# Patient Record
Sex: Female | Born: 1955
Health system: Southern US, Community
[De-identification: ages and names within clinical notes are randomized; demographics above are authoritative.]

## PROBLEM LIST (undated history)

## (undated) DIAGNOSIS — E8881 Metabolic syndrome: Secondary | ICD-10-CM

## (undated) DIAGNOSIS — E663 Overweight: Secondary | ICD-10-CM

## (undated) DIAGNOSIS — R928 Other abnormal and inconclusive findings on diagnostic imaging of breast: Secondary | ICD-10-CM

## (undated) DIAGNOSIS — K2270 Barrett's esophagus without dysplasia: Secondary | ICD-10-CM

## (undated) DIAGNOSIS — K219 Gastro-esophageal reflux disease without esophagitis: Secondary | ICD-10-CM

## (undated) DIAGNOSIS — M25562 Pain in left knee: Secondary | ICD-10-CM

## (undated) DIAGNOSIS — E039 Hypothyroidism, unspecified: Secondary | ICD-10-CM

## (undated) DIAGNOSIS — E559 Vitamin D deficiency, unspecified: Secondary | ICD-10-CM

## (undated) DIAGNOSIS — E8940 Asymptomatic postprocedural ovarian failure: Secondary | ICD-10-CM

## (undated) DIAGNOSIS — E785 Hyperlipidemia, unspecified: Secondary | ICD-10-CM

## (undated) DIAGNOSIS — T7840XA Allergy, unspecified, initial encounter: Secondary | ICD-10-CM

## (undated) HISTORY — DX: Barrett's esophagus without dysplasia: K22.70

## (undated) HISTORY — DX: Gastro-esophageal reflux disease without esophagitis: K21.9

## (undated) HISTORY — DX: Hyperlipidemia, unspecified: E78.5

## (undated) HISTORY — DX: Vitamin D deficiency, unspecified: E55.9

## (undated) HISTORY — DX: Hypothyroidism, unspecified: E03.9

## (undated) HISTORY — DX: Pain in left knee: M25.562

## (undated) HISTORY — DX: Metabolic syndrome: E88.810

## (undated) HISTORY — DX: Other abnormal and inconclusive findings on diagnostic imaging of breast: R92.8

## (undated) HISTORY — DX: Overweight: E66.3

## (undated) HISTORY — DX: Metabolic syndrome: E88.81

## (undated) HISTORY — DX: Asymptomatic postprocedural ovarian failure: E89.40

## (undated) HISTORY — PX: ABDOMINAL HYSTERECTOMY: SHX81

## (undated) HISTORY — DX: Allergy, unspecified, initial encounter: T78.40XA

---

## 2004-07-22 ENCOUNTER — Ambulatory Visit: Payer: Self-pay | Admitting: Family Medicine

## 2005-08-05 ENCOUNTER — Ambulatory Visit: Payer: Self-pay | Admitting: Family Medicine

## 2006-07-07 ENCOUNTER — Ambulatory Visit: Payer: Self-pay | Admitting: Unknown Physician Specialty

## 2006-08-23 ENCOUNTER — Ambulatory Visit: Payer: Self-pay | Admitting: Family Medicine

## 2007-08-24 ENCOUNTER — Ambulatory Visit: Payer: Self-pay | Admitting: Family Medicine

## 2008-08-25 ENCOUNTER — Ambulatory Visit: Payer: Self-pay | Admitting: Family Medicine

## 2008-10-24 ENCOUNTER — Ambulatory Visit: Payer: Self-pay | Admitting: Unknown Physician Specialty

## 2009-09-02 ENCOUNTER — Ambulatory Visit: Payer: Self-pay | Admitting: Family Medicine

## 2011-02-08 ENCOUNTER — Ambulatory Visit: Payer: Self-pay | Admitting: Family Medicine

## 2012-06-20 ENCOUNTER — Ambulatory Visit: Payer: Self-pay

## 2012-07-31 ENCOUNTER — Ambulatory Visit: Payer: Self-pay

## 2013-01-20 ENCOUNTER — Emergency Department: Payer: Self-pay | Admitting: Emergency Medicine

## 2013-01-30 ENCOUNTER — Ambulatory Visit: Payer: Self-pay

## 2013-07-17 ENCOUNTER — Ambulatory Visit: Payer: Self-pay | Admitting: Family Medicine

## 2013-07-17 LAB — HM MAMMOGRAPHY: HM MAMMO: NORMAL

## 2014-01-07 LAB — HM COLONOSCOPY: HM Colonoscopy: NORMAL

## 2014-01-09 ENCOUNTER — Ambulatory Visit: Payer: Self-pay | Admitting: Unknown Physician Specialty

## 2014-01-10 ENCOUNTER — Other Ambulatory Visit: Payer: Self-pay | Admitting: Unknown Physician Specialty

## 2014-01-10 DIAGNOSIS — Q438 Other specified congenital malformations of intestine: Secondary | ICD-10-CM

## 2014-01-22 ENCOUNTER — Ambulatory Visit
Admission: RE | Admit: 2014-01-22 | Discharge: 2014-01-22 | Disposition: A | Discharge status: Home / Self Care | Payer: BC Managed Care – PPO | Source: Ambulatory Visit | Attending: Unknown Physician Specialty | Admitting: Unknown Physician Specialty

## 2014-01-22 DIAGNOSIS — Q438 Other specified congenital malformations of intestine: Secondary | ICD-10-CM

## 2014-02-19 LAB — HEMOGLOBIN A1C: HEMOGLOBIN A1C: 6.3 % — AB (ref 4.0–6.0)

## 2014-02-19 LAB — LIPID PANEL
CHOLESTEROL: 246 mg/dL — AB (ref 0–200)
HDL: 62 mg/dL (ref 35–70)
LDL Cholesterol: 167 mg/dL
Triglycerides: 87 mg/dL (ref 40–160)

## 2014-05-09 ENCOUNTER — Other Ambulatory Visit: Payer: Self-pay | Admitting: Family Medicine

## 2014-05-09 DIAGNOSIS — Z1231 Encounter for screening mammogram for malignant neoplasm of breast: Secondary | ICD-10-CM

## 2014-05-19 LAB — SURGICAL PATHOLOGY

## 2014-07-24 ENCOUNTER — Ambulatory Visit
Admission: RE | Admit: 2014-07-24 | Discharge: 2014-07-24 | Disposition: A | Discharge status: Home / Self Care | Payer: BLUE CROSS/BLUE SHIELD | Source: Ambulatory Visit | Attending: Family Medicine | Admitting: Family Medicine

## 2014-07-24 DIAGNOSIS — Z1231 Encounter for screening mammogram for malignant neoplasm of breast: Secondary | ICD-10-CM | POA: Insufficient documentation

## 2014-08-07 ENCOUNTER — Encounter: Payer: Self-pay | Admitting: Family Medicine

## 2014-08-07 DIAGNOSIS — Z833 Family history of diabetes mellitus: Secondary | ICD-10-CM | POA: Insufficient documentation

## 2014-08-07 DIAGNOSIS — E8881 Metabolic syndrome: Secondary | ICD-10-CM | POA: Insufficient documentation

## 2014-08-07 DIAGNOSIS — D126 Benign neoplasm of colon, unspecified: Secondary | ICD-10-CM | POA: Insufficient documentation

## 2014-08-07 DIAGNOSIS — K2100 Gastro-esophageal reflux disease with esophagitis, without bleeding: Secondary | ICD-10-CM | POA: Insufficient documentation

## 2014-08-07 DIAGNOSIS — K2270 Barrett's esophagus without dysplasia: Secondary | ICD-10-CM | POA: Insufficient documentation

## 2014-08-07 DIAGNOSIS — J3089 Other allergic rhinitis: Secondary | ICD-10-CM | POA: Insufficient documentation

## 2014-08-07 DIAGNOSIS — E785 Hyperlipidemia, unspecified: Secondary | ICD-10-CM | POA: Insufficient documentation

## 2014-08-07 DIAGNOSIS — E559 Vitamin D deficiency, unspecified: Secondary | ICD-10-CM | POA: Insufficient documentation

## 2014-08-07 DIAGNOSIS — E039 Hypothyroidism, unspecified: Secondary | ICD-10-CM | POA: Insufficient documentation

## 2014-08-07 DIAGNOSIS — Z86018 Personal history of other benign neoplasm: Secondary | ICD-10-CM | POA: Insufficient documentation

## 2014-08-07 DIAGNOSIS — E669 Obesity, unspecified: Secondary | ICD-10-CM | POA: Insufficient documentation

## 2014-08-08 ENCOUNTER — Ambulatory Visit (INDEPENDENT_AMBULATORY_CARE_PROVIDER_SITE_OTHER): Payer: BLUE CROSS/BLUE SHIELD | Admitting: Family Medicine

## 2014-08-08 ENCOUNTER — Encounter: Payer: Self-pay | Admitting: Family Medicine

## 2014-08-08 VITALS — BP 118/70 | HR 86 | Temp 97.9°F | Resp 16 | Ht 60.0 in | Wt 152.4 lb

## 2014-08-08 DIAGNOSIS — K2270 Barrett's esophagus without dysplasia: Secondary | ICD-10-CM | POA: Diagnosis not present

## 2014-08-08 DIAGNOSIS — Z79899 Other long term (current) drug therapy: Secondary | ICD-10-CM

## 2014-08-08 DIAGNOSIS — N958 Other specified menopausal and perimenopausal disorders: Secondary | ICD-10-CM

## 2014-08-08 DIAGNOSIS — E8881 Metabolic syndrome: Secondary | ICD-10-CM | POA: Diagnosis not present

## 2014-08-08 DIAGNOSIS — E039 Hypothyroidism, unspecified: Secondary | ICD-10-CM

## 2014-08-08 DIAGNOSIS — K2100 Gastro-esophageal reflux disease with esophagitis, without bleeding: Secondary | ICD-10-CM

## 2014-08-08 DIAGNOSIS — Z862 Personal history of diseases of the blood and blood-forming organs and certain disorders involving the immune mechanism: Secondary | ICD-10-CM | POA: Insufficient documentation

## 2014-08-08 DIAGNOSIS — Z780 Asymptomatic menopausal state: Secondary | ICD-10-CM

## 2014-08-08 DIAGNOSIS — E559 Vitamin D deficiency, unspecified: Secondary | ICD-10-CM | POA: Diagnosis not present

## 2014-08-08 DIAGNOSIS — J3089 Other allergic rhinitis: Secondary | ICD-10-CM

## 2014-08-08 DIAGNOSIS — E785 Hyperlipidemia, unspecified: Secondary | ICD-10-CM

## 2014-08-08 DIAGNOSIS — D509 Iron deficiency anemia, unspecified: Secondary | ICD-10-CM

## 2014-08-08 DIAGNOSIS — Z90710 Acquired absence of both cervix and uterus: Secondary | ICD-10-CM | POA: Insufficient documentation

## 2014-08-08 DIAGNOSIS — J309 Allergic rhinitis, unspecified: Secondary | ICD-10-CM

## 2014-08-08 MED ORDER — LEVOTHYROXINE SODIUM 25 MCG PO TABS: 25.0000 ug | ORAL_TABLET | Freq: Every day | ORAL | Status: DC

## 2014-08-08 MED ORDER — ESTRADIOL 0.5 MG PO TABS: 0.5000 mg | ORAL_TABLET | Freq: Every day | ORAL | Status: DC

## 2014-08-08 MED ORDER — MONTELUKAST SODIUM 10 MG PO TABS: 10.0000 mg | ORAL_TABLET | Freq: Every day | ORAL | Status: DC

## 2014-08-08 MED ORDER — FERROUS SULFATE 325 (65 FE) MG PO TABS: 325.0000 mg | ORAL_TABLET | Freq: Every day | ORAL | Status: DC

## 2014-08-08 MED ORDER — PANTOPRAZOLE SODIUM 40 MG PO TBEC: 40.0000 mg | DELAYED_RELEASE_TABLET | Freq: Every day | ORAL | Status: DC

## 2014-08-08 MED ORDER — FLUTICASONE PROPIONATE 50 MCG/ACT NA SUSP: 2.0000 | Freq: Every day | NASAL | Status: DC

## 2014-08-08 NOTE — Progress Notes (Signed)
Name: Colleen Curry   MRN: [id number]    DOB: Dec 03, 1955   Date:08/08/2014       Progress Note  Subjective  Chief Complaint  Chief Complaint  Patient presents with  . Hypothyroidism  . Allergic Rhinitis     HPI  Hypothyroidism: taking Synthroid  25 mcg daily, she denies constipation, but she has dry skin, using moisturizer, no weight gain.  AR: she has symptoms year round, but at this time controlled with medication, occasional sneezing, no rhinorrhea but still has some congestion   Menopause: s/p hysterectomy in 1991 for fibroid tumors, she has been taking Estradiol for many years, hot flashes and night sweats are not as intense.   Iron deficiency anemia: diagnosed last fall, seen by GI. Up to date with EGD and colonoscopy . She has a history of Barretts esophagus, taking iron supplementation. Denies fatigue, SOB or chest pain. She was not eating meat last year, but has incorporated it to her diet now  GERD: under control with medications  Hyperlipidemia: on diet and exercise only, never took statins   Patient Active Problem List   Diagnosis Date Noted  . History of hysterectomy 08/08/2014  . Menopause 08/08/2014  . Iron deficiency anemia 08/08/2014  . Perennial allergic rhinitis 08/07/2014  . Barrett esophagus 08/07/2014  . Dyslipidemia 08/07/2014  . Family history of diabetes mellitus 08/07/2014  . Esophagitis, reflux 08/07/2014  . Acquired hypothyroidism 08/07/2014  . Dysmetabolic syndrome 78/67/6720  . Obesity (BMI 30-39.9) 08/07/2014  . Vitamin D deficiency 08/07/2014  . Tubular adenoma of colon 08/07/2014    Past Surgical History  Procedure Laterality Date  . Abdominal hysterectomy      Family History  Problem Relation Age of Onset  . Glaucoma Brother   . Stroke Paternal Grandfather     History   Social History  . Marital Status: Divorced    Spouse Name: N/A  . Number of Children: N/A  . Years of Education: N/A   Occupational History  . Not on  file.   Social History Main Topics  . Smoking status: Never Smoker   . Smokeless tobacco: Not on file  . Alcohol Use: No  . Drug Use: No  . Sexual Activity: Not on file   Other Topics Concern  . Not on file   Social History Narrative     Current outpatient prescriptions:  .  aspirin (ASPIRIN ADULT LOW STRENGTH) 81 MG chewable tablet, Chew by mouth., Disp: , Rfl:  .  Azelastine HCl 0.15 % SOLN, Place into the nose., Disp: , Rfl:  .  Biotin 1000 MCG tablet, Take 1 tablet by mouth daily., Disp: , Rfl:  .  Cholecalciferol (VITAMIN D-1000 MAX ST) 1000 UNITS tablet, Take 1 tablet by mouth daily., Disp: , Rfl:  .  estradiol (ESTRACE) 0.5 MG tablet, Take 1 tablet (0.5 mg total) by mouth daily., Disp: 90 tablet, Rfl: 0 .  fluticasone (FLONASE) 50 MCG/ACT nasal spray, Place 2 sprays into both nostrils daily., Disp: 48 g, Rfl: 1 .  levothyroxine (SYNTHROID, LEVOTHROID) 25 MCG tablet, Take 1 tablet (25 mcg total) by mouth daily before breakfast., Disp: 90 tablet, Rfl: 1 .  loratadine (CLARITIN) 10 MG tablet, Take by mouth., Disp: , Rfl:  .  montelukast (SINGULAIR) 10 MG tablet, Take 1 tablet (10 mg total) by mouth at bedtime., Disp: 90 tablet, Rfl: 1 .  pantoprazole (PROTONIX) 40 MG tablet, Take 1 tablet (40 mg total) by mouth daily., Disp: 90 tablet, Rfl: 1  Allergies  Allergen Reactions  . Codeine Sulfate Other (See Comments)  . Latex Other (See Comments)     ROS  Constitutional: Negative for fever or weight change.  Respiratory: Negative for cough and shortness of breath.   Cardiovascular: Negative for chest pain or palpitations.  Gastrointestinal: Negative for abdominal pain, no bowel changes.  Musculoskeletal: Negative for gait problem or joint swelling.  Skin: Negative for rash.  Neurological: Negative for dizziness or headache.  No other specific complaints in a complete review of systems (except as listed in HPI above).  Objective  Filed Vitals:   08/08/14 1054  BP:  118/70  Pulse: 86  Temp: 97.9 F (36.6 C)  Resp: 16  Height: 5' (1.524 m)  Weight: 152 lb 7 oz (69.145 kg)  SpO2: 97%    Body mass index is 29.77 kg/(m^2).  Physical Exam Constitutional: Patient appears well-developed and well-nourished. No distress.  Eyes:  No scleral icterus.  Neck: Normal range of motion. Neck supple. Cardiovascular: Normal rate, regular rhythm and normal heart sounds.  No murmur heard. No BLE edema. Pulmonary/Chest: Effort normal and breath sounds normal. No respiratory distress. Abdominal: Soft.  There is no tenderness. Psychiatric: Patient has a normal mood and affect. behavior is normal. Judgment and thought content normal.    PHQ2/9: Depression screen PHQ 2/9 08/08/2014  Decreased Interest 0  Down, Depressed, Hopeless 0  PHQ - 2 Score 0     Fall Risk: Fall Risk  08/08/2014  Falls in the past year? No     Assessment & Plan  1. Barrett esophagus  - pantoprazole (PROTONIX) 40 MG tablet; Take 1 tablet (40 mg total) by mouth daily.  Dispense: 90 tablet; Refill: 1 - CBC with Differential  2. Esophagitis, reflux Continue medication   3. Dyslipidemia  - Lipid Profile  4. Acquired hypothyroidism  - levothyroxine (SYNTHROID, LEVOTHROID) 25 MCG tablet; Take 1 tablet (25 mcg total) by mouth daily before breakfast.  Dispense: 90 tablet; Refill: 1 - TSH  5. Dysmetabolic syndrome  - HgB P2Z  6. Iron deficiency anemia  - CBC with Differential - Iron - Iron Binding Cap (TIBC) - Ferritin  7. Vitamin D deficiency  - Vitamin D (25 hydroxy)  8. Perennial allergic rhinitis  - montelukast (SINGULAIR) 10 MG tablet; Take 1 tablet (10 mg total) by mouth at bedtime.  Dispense: 90 tablet; Refill: 1 - fluticasone (FLONASE) 50 MCG/ACT nasal spray; Place 2 sprays into both nostrils daily.  Dispense: 48 g; Refill: 1  9. Menopause Advised her to wean self off medication  - estradiol (ESTRACE) 0.5 MG tablet; Take 1 tablet (0.5 mg total) by mouth  daily.  Dispense: 90 tablet; Refill: 0  10. Long-term use of high-risk medication  - Comprehensive Metabolic Panel (CMET)

## 2014-09-02 LAB — FERRITIN: FERRITIN: 180

## 2015-01-28 ENCOUNTER — Ambulatory Visit (INDEPENDENT_AMBULATORY_CARE_PROVIDER_SITE_OTHER): Payer: BLUE CROSS/BLUE SHIELD | Admitting: Family Medicine

## 2015-01-28 ENCOUNTER — Encounter: Payer: Self-pay | Admitting: Family Medicine

## 2015-01-28 VITALS — BP 118/78 | HR 82 | Temp 97.4°F | Resp 16 | Wt 158.8 lb

## 2015-01-28 DIAGNOSIS — J069 Acute upper respiratory infection, unspecified: Secondary | ICD-10-CM

## 2015-01-28 DIAGNOSIS — E785 Hyperlipidemia, unspecified: Secondary | ICD-10-CM | POA: Diagnosis not present

## 2015-01-28 DIAGNOSIS — E559 Vitamin D deficiency, unspecified: Secondary | ICD-10-CM | POA: Diagnosis not present

## 2015-01-28 DIAGNOSIS — E039 Hypothyroidism, unspecified: Secondary | ICD-10-CM | POA: Diagnosis not present

## 2015-01-28 DIAGNOSIS — K2270 Barrett's esophagus without dysplasia: Secondary | ICD-10-CM

## 2015-01-28 DIAGNOSIS — Z79899 Other long term (current) drug therapy: Secondary | ICD-10-CM

## 2015-01-28 DIAGNOSIS — J309 Allergic rhinitis, unspecified: Secondary | ICD-10-CM | POA: Diagnosis not present

## 2015-01-28 DIAGNOSIS — E8881 Metabolic syndrome: Secondary | ICD-10-CM

## 2015-01-28 DIAGNOSIS — D509 Iron deficiency anemia, unspecified: Secondary | ICD-10-CM

## 2015-01-28 DIAGNOSIS — J3089 Other allergic rhinitis: Secondary | ICD-10-CM

## 2015-01-28 MED ORDER — BENZONATATE 100 MG PO CAPS: 100.0000 mg | ORAL_CAPSULE | Freq: Three times a day (TID) | ORAL | Status: DC | PRN

## 2015-01-28 MED ORDER — HYDROCOD POLST-CPM POLST ER 10-8 MG/5ML PO SUER: 5.0000 mL | Freq: Every evening | ORAL | Status: DC | PRN

## 2015-01-28 MED ORDER — PANTOPRAZOLE SODIUM 40 MG PO TBEC: 40.0000 mg | DELAYED_RELEASE_TABLET | Freq: Every day | ORAL | Status: DC

## 2015-01-28 MED ORDER — PREDNISONE 10 MG PO TABS: 10.0000 mg | ORAL_TABLET | Freq: Every day | ORAL | Status: DC

## 2015-01-28 MED ORDER — MONTELUKAST SODIUM 10 MG PO TABS: 10.0000 mg | ORAL_TABLET | Freq: Every day | ORAL | Status: DC

## 2015-01-28 NOTE — Progress Notes (Signed)
Name: Colleen Curry   MRN: [id number]    DOB: 28-Apr-1955   Date:01/28/2015       Progress Note  Subjective  Chief Complaint  Chief Complaint  Patient presents with  . Follow-up    patient is here for her 82-month follow-up.   Marland Kitchen Hypothyroidism  . Anemia  . URI    patient stated that she has had a dry cough for about 2 weeks. also she has has some dizziness and bladder leakage.    HPI  Hypothyroidism: taking Synthroid 25 mcg daily, she denies constipation, but she has dry skin, using moisturizer, 6 lbs weight gain. No fatigue ( except for the past week when she got sick with a cold )  AR: she has symptoms year round,occasional sneezing, mild rhinorrhea, severe tearing and also  some nasal congestion, itchy eye lids  Menopause: s/p hysterectomy in 1991 for fibroid tumors, she has been taking Estradiol for many years, hot flashes and night sweats are not as intense.   Iron deficiency anemia: diagnosed fall 2015  seen by GI. Up to date with EGD and colonoscopy . She has a history of Barretts esophagus, taking iron supplementation. Denies fatigue, SOB or chest pain. She is eating meat again.   GERD: under control with medications, no regurgitation, no heartburn and taking Protonix daily - GI - Dr. Vira Agar   Hyperlipidemia: on diet and exercise only, never took statins  URI/versus AR: she states about one month she developed body aches, headaches, followed by sore throat and hoarseness. Progressed to nasal congestion and post-nasal drainage. One week later decrease in appetite, nausea. She also has a dry cough for the past month, no wheezing or chest pain. She is taking allergy medications. Symptoms got worse last week when her neighbors were burning some wood.   Patient Active Problem List   Diagnosis Date Noted  . History of hysterectomy 08/08/2014  . Menopause 08/08/2014  . Iron deficiency anemia 08/08/2014  . Perennial allergic rhinitis 08/07/2014  . Barrett esophagus 08/07/2014  .  Dyslipidemia 08/07/2014  . Family history of diabetes mellitus 08/07/2014  . Esophagitis, reflux 08/07/2014  . Acquired hypothyroidism 08/07/2014  . Dysmetabolic syndrome 123456  . Obesity (BMI 30-39.9) 08/07/2014  . Vitamin D deficiency 08/07/2014  . Tubular adenoma of colon 08/07/2014    Past Surgical History  Procedure Laterality Date  . Abdominal hysterectomy      Family History  Problem Relation Age of Onset  . Glaucoma Brother   . Stroke Paternal Grandfather     Social History   Social History  . Marital Status: Divorced    Spouse Name: N/A  . Number of Children: N/A  . Years of Education: N/A   Occupational History  . Not on file.   Social History Main Topics  . Smoking status: Never Smoker   . Smokeless tobacco: Not on file  . Alcohol Use: No  . Drug Use: No  . Sexual Activity: Not on file   Other Topics Concern  . Not on file   Social History Narrative     Current outpatient prescriptions:  .  aspirin (ASPIRIN ADULT LOW STRENGTH) 81 MG chewable tablet, Chew by mouth., Disp: , Rfl:  .  Azelastine HCl 0.15 % SOLN, Place into the nose., Disp: , Rfl:  .  Biotin 1000 MCG tablet, Take 1 tablet by mouth daily., Disp: , Rfl:  .  Cholecalciferol (VITAMIN D-1000 MAX ST) 1000 UNITS tablet, Take 1 tablet by mouth daily., Disp: ,  Rfl:  .  estradiol (ESTRACE) 0.5 MG tablet, Take 1 tablet (0.5 mg total) by mouth daily., Disp: 90 tablet, Rfl: 0 .  ferrous sulfate 325 (65 FE) MG tablet, Take 1 tablet (325 mg total) by mouth daily with breakfast., Disp: 30 tablet, Rfl: 0 .  fluticasone (FLONASE) 50 MCG/ACT nasal spray, Place 2 sprays into both nostrils daily., Disp: 48 g, Rfl: 1 .  levothyroxine (SYNTHROID, LEVOTHROID) 25 MCG tablet, Take 1 tablet (25 mcg total) by mouth daily before breakfast., Disp: 90 tablet, Rfl: 1 .  loratadine (CLARITIN) 10 MG tablet, Take by mouth., Disp: , Rfl:  .  montelukast (SINGULAIR) 10 MG tablet, Take 1 tablet (10 mg total) by mouth at  bedtime., Disp: 90 tablet, Rfl: 1 .  pantoprazole (PROTONIX) 40 MG tablet, Take 1 tablet (40 mg total) by mouth daily., Disp: 90 tablet, Rfl: 1 .  benzonatate (TESSALON) 100 MG capsule, Take 1-2 capsules (100-200 mg total) by mouth 3 (three) times daily as needed for cough., Disp: 40 capsule, Rfl: 0 .  chlorpheniramine-HYDROcodone (TUSSIONEX PENNKINETIC ER) 10-8 MG/5ML SUER, Take 5 mLs by mouth at bedtime as needed for cough., Disp: 140 mL, Rfl: 0  Allergies  Allergen Reactions  . Codeine Sulfate Other (See Comments)  . Latex Other (See Comments)     ROS  Constitutional: Negative for fever , positive for weight change.  Respiratory: Positive for cough no  shortness of breath.   Cardiovascular: Negative for chest pain or palpitations.  Gastrointestinal: Negative for abdominal pain, no bowel changes.  Musculoskeletal: Negative for gait problem or joint swelling.  Skin: Negative for rash.  Neurological: Negative for dizziness or headache.  No other specific complaints in a complete review of systems (except as listed in HPI above).  Objective  Filed Vitals:   01/28/15 0914  BP: 118/78  Pulse: 82  Temp: 97.4 F (36.3 C)  TempSrc: Oral  Resp: 16  Weight: 158 lb 12.8 oz (72.031 kg)  SpO2: 99%    Body mass index is 31.01 kg/(m^2).  Physical Exam  Constitutional: Patient appears well-developed and well-nourished. Obese  No distress.  HEENT: head atraumatic, normocephalic, pupils equal and reactive to light, ears TM, boggy turbinates,, neck supple, throat within normal limits Cardiovascular: Normal rate, regular rhythm and normal heart sounds.  No murmur heard. No BLE edema. Pulmonary/Chest: Effort normal and breath sounds normal. No respiratory distress. Abdominal: Soft.  There is no tenderness. Psychiatric: Patient has a normal mood and affect. behavior is normal. Judgment and thought content normal.  PHQ2/9: Depression screen [facility] 2/9 01/28/2015 08/08/2014  Decreased Interest  0 0  Down, Depressed, Hopeless 0 0  PHQ - 2 Score 0 0    Fall Risk: Fall Risk  01/28/2015 08/08/2014  Falls in the past year? No No    Functional Status Survey: Is the patient deaf or have difficulty hearing?: No Does the patient have difficulty seeing, even when wearing glasses/contacts?: No Does the patient have difficulty concentrating, remembering, or making decisions?: No Does the patient have difficulty walking or climbing stairs?: No Does the patient have difficulty dressing or bathing?: No Does the patient have difficulty doing errands alone such as visiting a doctor's office or shopping?: No    Assessment & Plan  1. Barrett esophagus  - pantoprazole (PROTONIX) 40 MG tablet; Take 1 tablet (40 mg total) by mouth daily.  Dispense: 90 tablet; Refill: 1  2. Upper respiratory infection  Possible bronchitis, normal lung exam today, advised to call back for  follow up and CXR if no improvement with medication - benzonatate (TESSALON) 100 MG capsule; Take 1-2 capsules (100-200 mg total) by mouth 3 (three) times daily as needed for cough.  Dispense: 40 capsule; Refill: 0 - chlorpheniramine-HYDROcodone (TUSSIONEX PENNKINETIC ER) 10-8 MG/5ML SUER; Take 5 mLs by mouth at bedtime as needed for cough.  Dispense: 140 mL; Refill: 0 - predniSONE (DELTASONE) 10 MG tablet; Take 1 tablet (10 mg total) by mouth daily with breakfast.  Dispense: 10 tablet; Refill: 0  3. Iron deficiency anemia  - CBC with Differential/Platelet - Ferritin  4. Dyslipidemia  - Lipid panel  5. Acquired hypothyroidism  - TSH  6. Dysmetabolic syndrome  - Hemoglobin A1c  7. Vitamin D deficiency  - VITAMIN D 25 Hydroxy (Vit-D Deficiency, Fractures)  8. Perennial allergic rhinitis  Continue medication   9. Long-term use of high-risk medication  - Comprehensive metabolic panel

## 2015-02-05 ENCOUNTER — Encounter: Payer: Self-pay | Admitting: Family Medicine

## 2015-04-13 ENCOUNTER — Encounter: Payer: Self-pay | Admitting: Family Medicine

## 2015-04-13 ENCOUNTER — Ambulatory Visit (INDEPENDENT_AMBULATORY_CARE_PROVIDER_SITE_OTHER): Payer: BLUE CROSS/BLUE SHIELD | Admitting: Family Medicine

## 2015-04-13 VITALS — BP 126/78 | HR 71 | Temp 98.6°F | Resp 16 | Wt 158.6 lb

## 2015-04-13 DIAGNOSIS — R519 Headache, unspecified: Secondary | ICD-10-CM

## 2015-04-13 DIAGNOSIS — E8881 Metabolic syndrome: Secondary | ICD-10-CM

## 2015-04-13 DIAGNOSIS — Z01419 Encounter for gynecological examination (general) (routine) without abnormal findings: Secondary | ICD-10-CM

## 2015-04-13 DIAGNOSIS — E785 Hyperlipidemia, unspecified: Secondary | ICD-10-CM | POA: Diagnosis not present

## 2015-04-13 DIAGNOSIS — E039 Hypothyroidism, unspecified: Secondary | ICD-10-CM | POA: Diagnosis not present

## 2015-04-13 DIAGNOSIS — E669 Obesity, unspecified: Secondary | ICD-10-CM | POA: Diagnosis not present

## 2015-04-13 DIAGNOSIS — R51 Headache: Secondary | ICD-10-CM

## 2015-04-13 DIAGNOSIS — Z0000 Encounter for general adult medical examination without abnormal findings: Secondary | ICD-10-CM

## 2015-04-13 DIAGNOSIS — Z780 Asymptomatic menopausal state: Secondary | ICD-10-CM | POA: Diagnosis not present

## 2015-04-13 MED ORDER — CLONIDINE HCL 0.1 MG PO TABS: 0.1000 mg | ORAL_TABLET | Freq: Two times a day (BID) | ORAL | Status: DC

## 2015-04-13 MED ORDER — LEVOTHYROXINE SODIUM 25 MCG PO TABS: 25.0000 ug | ORAL_TABLET | Freq: Every day | ORAL | Status: DC

## 2015-04-13 MED ORDER — METFORMIN HCL 500 MG PO TABS: 500.0000 mg | ORAL_TABLET | Freq: Every day | ORAL | Status: DC

## 2015-04-13 NOTE — Progress Notes (Signed)
Name: Colleen Curry   MRN: [id number]    DOB: 09-Sep-1955   Date:04/13/2015       Progress Note  Subjective  Chief Complaint  Chief Complaint  Patient presents with  . Annual Exam  . Obesity    patient stated that she stays hungry all of the time.  Marland Kitchen Hot Flashes  . Headache    patient stated that she has had this headache since yesterday.    HPI  Well Woman Exam: not sexually active, no vaginal discharged, no post-menopausal bleeding, however annoyed with hot flashes and night sweats   (worse at night ) , she states she was doing better while on Estradiol.    Obesity: she is upset about her weight gain. She has prediabetes and has noticed increase in appetite, wants to eat all the time. She also states she has polydipsia and polyuria.   Headache: she has noticed mild frontal pressure since yesterday, described as pressure like - she has been without caffeine for the past 3 days. She states pain improved when she had a cup of tea today.   Dyslipidemia: reviewed labs with patient. ASCVD is low, no need for statin therapy at this time.   Hypothyroidism: she is taking Synrthroid , no weight gain since last visit, but she has noticed mild constipation, she has  dry skin.    Patient Active Problem List   Diagnosis Date Noted  . History of hysterectomy 08/08/2014  . Menopause 08/08/2014  . History of iron deficiency anemia 08/08/2014  . Perennial allergic rhinitis 08/07/2014  . Barrett esophagus 08/07/2014  . Dyslipidemia 08/07/2014  . Family history of diabetes mellitus 08/07/2014  . Esophagitis, reflux 08/07/2014  . Acquired hypothyroidism 08/07/2014  . Dysmetabolic syndrome 123456  . Obesity (BMI 30-39.9) 08/07/2014  . Vitamin D deficiency 08/07/2014  . Tubular adenoma of colon 08/07/2014    Past Surgical History  Procedure Laterality Date  . Abdominal hysterectomy      Family History  Problem Relation Age of Onset  . Glaucoma Brother   . Stroke Paternal  Grandfather     Social History   Social History  . Marital Status: Divorced    Spouse Name: N/A  . Number of Children: N/A  . Years of Education: N/A   Occupational History  . Not on file.   Social History Main Topics  . Smoking status: Never Smoker   . Smokeless tobacco: Not on file  . Alcohol Use: No  . Drug Use: No  . Sexual Activity: Not on file   Other Topics Concern  . Not on file   Social History Narrative     Current outpatient prescriptions:  .  aspirin (ASPIRIN ADULT LOW STRENGTH) 81 MG chewable tablet, Chew by mouth., Disp: , Rfl:  .  Azelastine HCl 0.15 % SOLN, Place into the nose., Disp: , Rfl:  .  Biotin 1000 MCG tablet, Take 1 tablet by mouth daily., Disp: , Rfl:  .  Cholecalciferol (VITAMIN D-1000 MAX ST) 1000 UNITS tablet, Take 1 tablet by mouth daily., Disp: , Rfl:  .  fluticasone (FLONASE) 50 MCG/ACT nasal spray, Place 2 sprays into both nostrils daily., Disp: 48 g, Rfl: 1 .  levothyroxine (SYNTHROID, LEVOTHROID) 25 MCG tablet, Take 1 tablet (25 mcg total) by mouth daily before breakfast., Disp: 90 tablet, Rfl: 1 .  loratadine (CLARITIN) 10 MG tablet, Take by mouth., Disp: , Rfl:  .  montelukast (SINGULAIR) 10 MG tablet, Take 1 tablet (10 mg total) by  mouth at bedtime., Disp: 90 tablet, Rfl: 1 .  pantoprazole (PROTONIX) 40 MG tablet, Take 1 tablet (40 mg total) by mouth daily., Disp: 90 tablet, Rfl: 1 .  cloNIDine (CATAPRES) 0.1 MG tablet, Take 1 tablet (0.1 mg total) by mouth 2 (two) times daily., Disp: 60 tablet, Rfl: 2 .  metFORMIN (GLUCOPHAGE) 500 MG tablet, Take 1 tablet (500 mg total) by mouth at bedtime., Disp: 30 tablet, Rfl: 2  Allergies  Allergen Reactions  . Codeine Sulfate Other (See Comments)  . Latex Other (See Comments)     ROS  Constitutional: Negative for fever or weight change.  Respiratory: Negative for cough and shortness of breath.   Cardiovascular: Negative for chest pain or palpitations.  Gastrointestinal: Negative for  abdominal pain, no bowel changes.  Musculoskeletal: Negative for gait problem or joint swelling.  Skin: Negative for rash.  Neurological: Negative for dizziness, positive for headache.  No other specific complaints in a complete review of systems (except as listed in HPI above).  Objective  Filed Vitals:   04/13/15 1406  BP: 126/78  Pulse: 71  Temp: 98.6 F (37 C)  TempSrc: Oral  Resp: 16  Weight: 158 lb 9.6 oz (71.94 kg)  SpO2: 98%    Body mass index is 30.97 kg/(m^2).  Physical Exam  Constitutional: Patient appears well-developed and obese. No distress.  HENT: Head: Normocephalic and atraumatic. Ears: B TMs ok, no erythema or effusion; Nose: Nose normal. Mouth/Throat: Oropharynx is clear and moist. No oropharyngeal exudate.  Eyes: Conjunctivae and EOM are normal. Pupils are equal, round, and reactive to light. No scleral icterus.  Neck: Normal range of motion. Neck supple. No JVD present. No thyromegaly present.  Cardiovascular: Normal rate, regular rhythm and normal heart sounds.  No murmur heard. No BLE edema. Pulmonary/Chest: Effort normal and breath sounds normal. No respiratory distress. Abdominal: Soft. Bowel sounds are normal, no distension. There is no tenderness. no masses Breast: no lumps or masses, no nipple discharge or rashes FEMALE GENITALIA:  External genitalia normal External urethra normal Not done RECTAL: not done Musculoskeletal: Normal range of motion, no joint effusions. No gross deformities Neurological: he is alert and oriented to person, place, and time. No cranial nerve deficit. Coordination, balance, strength, speech and gait are normal.  Skin: Skin is warm and dry. No rash noted. No erythema.  Psychiatric: Patient has a normal mood and affect. behavior is normal. Judgment and thought content normal.   PHQ2/9: Depression screen [facility] 2/9 04/13/2015 01/28/2015 08/08/2014  Decreased Interest 0 0 0  Down, Depressed, Hopeless 0 0 0  PHQ - 2 Score 0 0 0     Fall Risk: Fall Risk  04/13/2015 01/28/2015 08/08/2014  Falls in the past year? No No No    Functional Status Survey: Is the patient deaf or have difficulty hearing?: No Does the patient have difficulty seeing, even when wearing glasses/contacts?: No Does the patient have difficulty concentrating, remembering, or making decisions?: No Does the patient have difficulty walking or climbing stairs?: No Does the patient have difficulty dressing or bathing?: No Does the patient have difficulty doing errands alone such as visiting a doctor's office or shopping?: No   Assessment & Plan  1. Well woman exam  Discussed importance of 150 minutes of physical activity weekly, eat two servings of fish weekly, eat one serving of tree nuts ( cashews, pistachios, pecans, almonds.Marland Kitchen) every other day, eat 6 servings of fruit/vegetables daily and drink plenty of water and avoid sweet beverages.  2. Acquired hypothyroidism  - levothyroxine (SYNTHROID, LEVOTHROID) 25 MCG tablet; Take 1 tablet (25 mcg total) by mouth daily before breakfast.  Dispense: 90 tablet; Refill: 1 - TSH  3. Menopause  - cloNIDine (CATAPRES) 0.1 MG tablet; Take 1 tablet (0.1 mg total) by mouth 2 (two) times daily.  Dispense: 60 tablet; Refill: 2  4. Dysmetabolic syndrome  - Hemoglobin A1c  5. Obesity (BMI 30-39.9)  Discussed all optiosns for weight loss medications including Belviq, Qsymia, Saxenda and Contrave. Discussed risk and benefits of each of them.  6. Dyslipidemia  Discussed results  7. Headache, unspecified headache type  Likely caffeine withdrawal, normal neuro exam, increase fluid intake and monitor.

## 2015-04-20 LAB — HEMOGLOBIN A1C: HEMOGLOBIN A1C: 6.3

## 2015-04-23 ENCOUNTER — Encounter: Payer: Self-pay | Admitting: Family Medicine

## 2015-07-02 ENCOUNTER — Other Ambulatory Visit: Payer: Self-pay | Admitting: Family Medicine

## 2015-07-02 DIAGNOSIS — Z1231 Encounter for screening mammogram for malignant neoplasm of breast: Secondary | ICD-10-CM

## 2015-07-30 ENCOUNTER — Ambulatory Visit (INDEPENDENT_AMBULATORY_CARE_PROVIDER_SITE_OTHER): Payer: BLUE CROSS/BLUE SHIELD | Admitting: Family Medicine

## 2015-07-30 ENCOUNTER — Encounter: Payer: Self-pay | Admitting: Family Medicine

## 2015-07-30 ENCOUNTER — Ambulatory Visit: Payer: BLUE CROSS/BLUE SHIELD

## 2015-07-30 ENCOUNTER — Encounter (INDEPENDENT_AMBULATORY_CARE_PROVIDER_SITE_OTHER): Payer: Self-pay

## 2015-07-30 VITALS — HR 86 | Temp 98.8°F | Resp 16 | Wt 155.4 lb

## 2015-07-30 DIAGNOSIS — E8881 Metabolic syndrome: Secondary | ICD-10-CM | POA: Diagnosis not present

## 2015-07-30 DIAGNOSIS — E039 Hypothyroidism, unspecified: Secondary | ICD-10-CM

## 2015-07-30 DIAGNOSIS — Z79899 Other long term (current) drug therapy: Secondary | ICD-10-CM | POA: Diagnosis not present

## 2015-07-30 DIAGNOSIS — K2270 Barrett's esophagus without dysplasia: Secondary | ICD-10-CM

## 2015-07-30 DIAGNOSIS — J3089 Other allergic rhinitis: Secondary | ICD-10-CM

## 2015-07-30 DIAGNOSIS — E669 Obesity, unspecified: Secondary | ICD-10-CM

## 2015-07-30 DIAGNOSIS — E785 Hyperlipidemia, unspecified: Secondary | ICD-10-CM | POA: Diagnosis not present

## 2015-07-30 DIAGNOSIS — J309 Allergic rhinitis, unspecified: Secondary | ICD-10-CM

## 2015-07-30 DIAGNOSIS — Z780 Asymptomatic menopausal state: Secondary | ICD-10-CM | POA: Diagnosis not present

## 2015-07-30 DIAGNOSIS — L83 Acanthosis nigricans: Secondary | ICD-10-CM | POA: Diagnosis not present

## 2015-07-30 MED ORDER — METFORMIN HCL 500 MG PO TABS: 500.0000 mg | ORAL_TABLET | Freq: Every day | ORAL | Status: DC

## 2015-07-30 MED ORDER — PANTOPRAZOLE SODIUM 40 MG PO TBEC: 40.0000 mg | DELAYED_RELEASE_TABLET | Freq: Every day | ORAL | Status: DC

## 2015-07-30 MED ORDER — MONTELUKAST SODIUM 10 MG PO TABS
10.0000 mg | ORAL_TABLET | Freq: Every day | ORAL | Status: DC
Start: 2015-07-30 — End: 2016-02-20

## 2015-07-30 NOTE — Patient Instructions (Signed)
Acanthosis Nigricans Acanthosis nigricans is a disorder in which dark, velvety markings appear on the skin. CAUSES This condition may be caused by:  A hormonal or glandular disorder, such as diabetes.  Obesity.  Certain medicines, such as birth control pills.  A tumor. (This is rare.) Some people inherit the condition from their parents. RISK FACTORS This condition is more likely to develop in:  People who have a hormonal or glandular disorder.  People who are overweight.  People who take certain medicines.  People who have certain cancers, especially stomach cancer.  People who have dark-colored skin (dark complexion). SYMPTOMS The main symptom of this condition is velvety markings on the skin that are light brown, black, or grayish in color. The markings usually appear on the face, neck, armpits, inner thighs, and groin. In severe cases, markings may also appear on the lips, hands, breasts, eyelids, and mouth. DIAGNOSIS This condition may be diagnosed based on symptoms. Sometimes, a skin sample is taken for testing (skin biopsy). You may also have tests to help determine the cause of the condition. TREATMENT Treatment for this condition depends on the cause. Treatment may involve reducing insulin levels, which are often high in people who have this condition. Insulin levels can be reduced with:  Dietary changes, such as avoiding starchy foods and sugars.  Losing weight.  Medicines. Sometimes, treatment involves:  Medicines to improve the appearance of the skin.  Laser treatment to improve the appearance of the skin.  Surgical removal of the skin markings (dermabrasion). HOME CARE INSTRUCTIONS  Follow diet instructions from your health care provider.  Lose weight if you are overweight.  Take over-the-counter and prescription medicines only as told by your health care provider.  Keep all follow-up visits as told by your health care provider. This is  important. SEEK MEDICAL CARE IF:  The skin markings do not go away with treatment.  New skin markings develop on a part of the body where they rarely develop, such as on your lips, hands, breasts, eyelids, or mouth.  The condition recurs for an unknown reason.   This information is not intended to replace advice given to you by your health care provider. Make sure you discuss any questions you have with your health care provider.   Document Released: 01/10/2005 Document Revised: 10/01/2014 Document Reviewed: 03/06/2014 Elsevier Interactive Patient Education [employer].

## 2015-07-30 NOTE — Progress Notes (Signed)
Name: Colleen Curry   MRN: [id number]    DOB: 07-Sep-1955   Date:07/30/2015       Progress Note  Subjective  Chief Complaint  Chief Complaint  Patient presents with  . Follow-up    patient stated that the medication she was given has not helped with the hot flashes and the insomnia.  . Anemia  . Dyslipidemia  . Hypothyroidism  . Dysmetabolic syndrome  . Vitamin D deficiency  . Allergic Rhinitis     HPI  Obesity:  She has prediabetes she was started on Metformin in March 2017, she states no longer feels hungry all the time and has lost 4 lbs since last visit. She still has polydipsia and polyuria. She stopped drinking sodas.  Dyslipidemia: reviewed labs with patient. ASCVD is low, no need for statin therapy at this time.   Hypothyroidism: she is taking Synrthroid , she has lost weight since last visit, she no longer has constipation, but has chronic  dry skin.   AR: she has symptoms year round,occasional sneezing, mild rhinorrhea, watery eyes and  some nasal congestion, itchy eye lids. She is doing better on singulair every night.   Menopause: s/p hysterectomy in 1991 for fibroid tumors, she has been taking Estradiol for many years, we stopped the Estradiol this year and she is struggling with night sweats and it affects her sleep. She is taking Clonidine but does not seem to be working as well. She also states she wakes up to void, advised to decrease fluid intake after 7 pm and discussed adding Effexor or medication for sleep, but she wants to hold off for now.  Iron deficiency anemia: diagnosed fall 2015 seen by GI. Up to date with EGD and colonoscopy . She has a history of Barretts esophagus. Denies fatigue, SOB or chest pain. She is eating meat again.  Last CBC was normal . She is taking Pantoprazole and denies heart burn or dysphagia, odynophagia.    Patient Active Problem List   Diagnosis Date Noted  . History of hysterectomy 08/08/2014  . Menopause 08/08/2014  . History of  iron deficiency anemia 08/08/2014  . Perennial allergic rhinitis 08/07/2014  . Barrett esophagus 08/07/2014  . Dyslipidemia 08/07/2014  . Family history of diabetes mellitus 08/07/2014  . Esophagitis, reflux 08/07/2014  . Acquired hypothyroidism 08/07/2014  . Dysmetabolic syndrome 123456  . Obesity (BMI 30-39.9) 08/07/2014  . Vitamin D deficiency 08/07/2014  . Tubular adenoma of colon 08/07/2014    Past Surgical History  Procedure Laterality Date  . Abdominal hysterectomy      Family History  Problem Relation Age of Onset  . Glaucoma Brother   . Stroke Paternal Grandfather     Social History   Social History  . Marital Status: Divorced    Spouse Name: N/A  . Number of Children: N/A  . Years of Education: N/A   Occupational History  . Not on file.   Social History Main Topics  . Smoking status: Never Smoker   . Smokeless tobacco: Not on file  . Alcohol Use: No  . Drug Use: No  . Sexual Activity: Not on file   Other Topics Concern  . Not on file   Social History Narrative     Current outpatient prescriptions:  .  aspirin (ASPIRIN ADULT LOW STRENGTH) 81 MG chewable tablet, Chew by mouth., Disp: , Rfl:  .  Azelastine HCl 0.15 % SOLN, Place into the nose., Disp: , Rfl:  .  Biotin 1000 MCG tablet,  Take 1 tablet by mouth daily., Disp: , Rfl:  .  Cholecalciferol (VITAMIN D-1000 MAX ST) 1000 UNITS tablet, Take 1 tablet by mouth daily., Disp: , Rfl:  .  cloNIDine (CATAPRES) 0.1 MG tablet, Take 1 tablet (0.1 mg total) by mouth 2 (two) times daily., Disp: 60 tablet, Rfl: 2 .  fluticasone (FLONASE) 50 MCG/ACT nasal spray, Place 2 sprays into both nostrils daily., Disp: 48 g, Rfl: 1 .  levothyroxine (SYNTHROID, LEVOTHROID) 25 MCG tablet, Take 1 tablet (25 mcg total) by mouth daily before breakfast., Disp: 90 tablet, Rfl: 1 .  loratadine (CLARITIN) 10 MG tablet, Take by mouth., Disp: , Rfl:  .  metFORMIN (GLUCOPHAGE) 500 MG tablet, Take 1 tablet (500 mg total) by mouth  at bedtime., Disp: 90 tablet, Rfl: 1 .  montelukast (SINGULAIR) 10 MG tablet, Take 1 tablet (10 mg total) by mouth at bedtime., Disp: 90 tablet, Rfl: 1 .  pantoprazole (PROTONIX) 40 MG tablet, Take 1 tablet (40 mg total) by mouth daily., Disp: 90 tablet, Rfl: 1  Allergies  Allergen Reactions  . Codeine Sulfate Other (See Comments)  . Latex Other (See Comments)     ROS  Constitutional: Negative for fever, positive for weight change.  Respiratory: Negative for cough and shortness of breath.   Cardiovascular: Negative for chest pain or palpitations.  Gastrointestinal: Negative for abdominal pain, positive for  bowel changes - going to the bathroom two to three times daily since started on Metformin  Musculoskeletal: Negative for gait problem or joint swelling.  Skin: Negative for rash.  Neurological: Negative for dizziness or headache.  No other specific complaints in a complete review of systems (except as listed in HPI above).  Objective  Filed Vitals:   07/30/15 0757  Pulse: 86  Temp: 98.8 F (37.1 C)  TempSrc: Oral  Resp: 16  Weight: 155 lb 6.4 oz (70.489 kg)  SpO2: 97%    Body mass index is 30.35 kg/(m^2).  Physical Exam  Constitutional: Patient appears well-developed and well-nourished. Obese No distress.  HEENT: head atraumatic, normocephalic, pupils equal and reactive to light, neck supple, throat within normal limits Cardiovascular: Normal rate, regular rhythm and normal heart sounds.  No murmur heard. No BLE edema. Pulmonary/Chest: Effort normal and breath sounds normal. No respiratory distress. Abdominal: Soft.  There is no tenderness. Psychiatric: Patient has a normal mood and affect. behavior is normal. Judgment and thought content normal. Skin : acanthosis nigricans  PHQ2/9: Depression screen [facility] 2/9 07/30/2015 04/13/2015 01/28/2015 08/08/2014  Decreased Interest 0 0 0 0  Down, Depressed, Hopeless 0 0 0 0  PHQ - 2 Score 0 0 0 0    Fall Risk: Fall Risk   07/30/2015 04/13/2015 01/28/2015 08/08/2014  Falls in the past year? No No No No    Functional Status Survey: Is the patient deaf or have difficulty hearing?: No Does the patient have difficulty seeing, even when wearing glasses/contacts?: No Does the patient have difficulty concentrating, remembering, or making decisions?: No Does the patient have difficulty walking or climbing stairs?: No Does the patient have difficulty dressing or bathing?: No Does the patient have difficulty doing errands alone such as visiting a doctor's office or shopping?: No    Assessment & Plan  1. Dyslipidemia  - Lipid panel  2. Acquired hypothyroidism  - TSH  3. Menopause  Discussed options but she wants to hold off for now  4. Barrett esophagus  - pantoprazole (PROTONIX) 40 MG tablet; Take 1 tablet (40 mg total) by  mouth daily.  Dispense: 90 tablet; Refill: 1  5. Dysmetabolic syndrome  - Hemoglobin A1c - metFORMIN (GLUCOPHAGE) 500 MG tablet; Take 1 tablet (500 mg total) by mouth at bedtime.  Dispense: 90 tablet; Refill: 1  6. Obesity (BMI 30-39.9)  Continue the good work, losing weight  7. Long-term use of high-risk medication  - Comprehensive metabolic panel  8. Perennial allergic rhinitis  - montelukast (SINGULAIR) 10 MG tablet; Take 1 tablet (10 mg total) by mouth at bedtime.  Dispense: 90 tablet; Refill: 1

## 2015-08-11 ENCOUNTER — Ambulatory Visit
Admission: RE | Admit: 2015-08-11 | Discharge: 2015-08-11 | Disposition: A | Discharge status: Home / Self Care | Payer: BLUE CROSS/BLUE SHIELD | Source: Ambulatory Visit | Attending: Family Medicine | Admitting: Family Medicine

## 2015-08-11 ENCOUNTER — Other Ambulatory Visit: Payer: Self-pay | Admitting: Family Medicine

## 2015-08-11 DIAGNOSIS — Z1231 Encounter for screening mammogram for malignant neoplasm of breast: Secondary | ICD-10-CM | POA: Diagnosis not present

## 2015-09-22 LAB — LIPID PANEL
CHOLESTEROL: 241 mg/dL — AB (ref 0–200)
CHOLESTEROL: 241 mg/dL — AB (ref 0–200)
HDL: 54 mg/dL (ref 35–70)
HDL: 54 mg/dL (ref 35–70)
LDL CALC: 157 mg/dL
LDL Cholesterol: 30 mg/dL
TRIGLYCERIDES: 151 mg/dL (ref 40–160)
Triglycerides: 151 mg/dL (ref 40–160)

## 2015-09-22 LAB — HEMOGLOBIN A1C: HEMOGLOBIN A1C: 6.1

## 2015-09-22 LAB — TSH: TSH: 4.06 u[IU]/mL (ref 0.41–5.90)

## 2015-09-28 ENCOUNTER — Encounter: Payer: Self-pay | Admitting: Family Medicine

## 2015-10-28 ENCOUNTER — Encounter: Payer: Self-pay | Admitting: Family Medicine

## 2015-10-28 ENCOUNTER — Ambulatory Visit (INDEPENDENT_AMBULATORY_CARE_PROVIDER_SITE_OTHER): Payer: BLUE CROSS/BLUE SHIELD | Admitting: Family Medicine

## 2015-10-28 VITALS — BP 116/68 | HR 82 | Temp 98.2°F | Resp 16 | Ht 60.0 in | Wt 159.3 lb

## 2015-10-28 DIAGNOSIS — R0683 Snoring: Secondary | ICD-10-CM

## 2015-10-28 DIAGNOSIS — E669 Obesity, unspecified: Secondary | ICD-10-CM | POA: Diagnosis not present

## 2015-10-28 DIAGNOSIS — E8881 Metabolic syndrome: Secondary | ICD-10-CM | POA: Diagnosis not present

## 2015-10-28 DIAGNOSIS — E785 Hyperlipidemia, unspecified: Secondary | ICD-10-CM

## 2015-10-28 DIAGNOSIS — Z23 Encounter for immunization: Secondary | ICD-10-CM

## 2015-10-28 DIAGNOSIS — E039 Hypothyroidism, unspecified: Secondary | ICD-10-CM | POA: Diagnosis not present

## 2015-10-28 DIAGNOSIS — J3089 Other allergic rhinitis: Secondary | ICD-10-CM | POA: Diagnosis not present

## 2015-10-28 DIAGNOSIS — Z780 Asymptomatic menopausal state: Secondary | ICD-10-CM

## 2015-10-28 DIAGNOSIS — K2270 Barrett's esophagus without dysplasia: Secondary | ICD-10-CM

## 2015-10-28 MED ORDER — LEVOTHYROXINE SODIUM 25 MCG PO TABS: 25.0000 ug | ORAL_TABLET | Freq: Every day | ORAL | 1 refills | Status: DC

## 2015-10-28 NOTE — Progress Notes (Signed)
Name: Colleen Curry   MRN: [id number]    DOB: 05-31-1955   Date:10/28/2015       Progress Note  Subjective  Chief Complaint  Chief Complaint  Patient presents with  . Medication Refill    3 month F/U  . Hypothyroidism    Blood work done at work and Nurse told patient her Thyroid level was off.  Patient states she is having dry skin, hair loss, constipation.   . Gastroesophageal Reflux    Well controlled  . Allergic Rhinitis     Year round issue but stable at the moment.   . Obesity    Patient has gained 4 pounds since last visit.     HPI  Obesity:  She has prediabetes she was started on Metformin in March 2017, it was making her feel more full, but she stopped because of diarrhea and not much change on her weight  Dyslipidemia: reviewed labs with patient. ASCVD is low, no need for statin therapy at this time.   Hypothyroidism: she is taking Synrthroid , she has gained  since last visit, she denies constipation, but has chronic  dry skin. Advised to take Synthroid 25 mcg daily and increase to 50 mcg on Sundays  AR: she has symptoms year round,worse in Spring and Fall. She states nasal congestion has been constant for the past couple of weeks, she has been taking singulair and nasal spray, but not taking loratadine, explained that she can take loratadine twice daily   Menopause: s/p hysterectomy in 1991 for fibroid tumors, she has been taking Estradiol for many years, we stopped the Estradiol in 2017 year and she is struggling with night sweats and it affects her sleep. She has been taking Clonidine but not making much of a difference, advised her to stop it and see how she can handle it.   Iron deficiency anemia: diagnosed fall 2015 seen by GI. Up to date with EGD and colonoscopy . She has a history of Barretts esophagus. Denies fatigue, SOB or chest pain. She is eating meat again.  Last CBC was normal . She is taking Pantoprazole and denies heart burn or dysphagia, odynophagia.  Discussed long term side effects of PPI  Snoring: she snores at night, wakes up during night - once or twice - wakes up feeling tired, but denies morning headaches   Patient Active Problem List   Diagnosis Date Noted  . History of hysterectomy 08/08/2014  . Menopause 08/08/2014  . History of iron deficiency anemia 08/08/2014  . Perennial allergic rhinitis 08/07/2014  . Barrett esophagus 08/07/2014  . Dyslipidemia 08/07/2014  . Family history of diabetes mellitus 08/07/2014  . Esophagitis, reflux 08/07/2014  . Acquired hypothyroidism 08/07/2014  . Dysmetabolic syndrome 123456  . Obesity (BMI 30-39.9) 08/07/2014  . Vitamin D deficiency 08/07/2014  . Tubular adenoma of colon 08/07/2014    Past Surgical History:  Procedure Laterality Date  . ABDOMINAL HYSTERECTOMY      Family History  Problem Relation Age of Onset  . Glaucoma Brother   . Stroke Paternal Grandfather   . Breast cancer Neg Hx     Social History   Social History  . Marital status: Divorced    Spouse name: N/A  . Number of children: N/A  . Years of education: N/A   Occupational History  . Not on file.   Social History Main Topics  . Smoking status: Never Smoker  . Smokeless tobacco: Never Used  . Alcohol use No  . Drug  use: No  . Sexual activity: Not on file   Other Topics Concern  . Not on file   Social History Narrative  . No narrative on file     Current Outpatient Prescriptions:  .  aspirin (ASPIRIN ADULT LOW STRENGTH) 81 MG chewable tablet, Chew by mouth., Disp: , Rfl:  .  Biotin 1000 MCG tablet, Take 1 tablet by mouth daily., Disp: , Rfl:  .  fluticasone (FLONASE) 50 MCG/ACT nasal spray, Place 2 sprays into both nostrils daily., Disp: 48 g, Rfl: 1 .  levothyroxine (SYNTHROID, LEVOTHROID) 25 MCG tablet, Take 1-2 tablets (25-50 mcg total) by mouth daily before breakfast. One daily and two on Sunday, Disp: 100 tablet, Rfl: 1 .  loratadine (CLARITIN) 10 MG tablet, Take by mouth., Disp: ,  Rfl:  .  montelukast (SINGULAIR) 10 MG tablet, Take 1 tablet (10 mg total) by mouth at bedtime., Disp: 90 tablet, Rfl: 1 .  pantoprazole (PROTONIX) 40 MG tablet, Take 1 tablet (40 mg total) by mouth daily., Disp: 90 tablet, Rfl: 1  Allergies  Allergen Reactions  . Codeine Sulfate Other (See Comments)  . Latex Other (See Comments)     ROS  Constitutional: Negative for fever or significant  weight change.  Respiratory: Negative for cough and shortness of breath.   Cardiovascular: Negative for chest pain or palpitations.  Gastrointestinal: Negative for abdominal pain, no bowel changes.  Musculoskeletal: Negative for gait problem or joint swelling.  Skin: Negative for rash.  Neurological: Negative for dizziness or headache.  No other specific complaints in a complete review of systems (except as listed in HPI above).  Objective  Vitals:   10/28/15 0803  BP: 116/68  Pulse: 82  Resp: 16  Temp: 98.2 F (36.8 C)  TempSrc: Oral  SpO2: 97%  Weight: 159 lb 4.8 oz (72.3 kg)  Height: 5' (1.524 m)    Body mass index is 31.11 kg/m.  Physical Exam  Constitutional: Patient appears well-developed and well-nourished. Obese  No distress.  HEENT: head atraumatic, normocephalic, pupils equal and reactive to light,neck supple, throat within normal limits Cardiovascular: Normal rate, regular rhythm and normal heart sounds.  No murmur heard. No BLE edema. Pulmonary/Chest: Effort normal and breath sounds normal. No respiratory distress. Abdominal: Soft.  There is no tenderness. Psychiatric: Patient has a normal mood and affect. behavior is normal. Judgment and thought content normal.  Recent Results (from the past 2160 hour(s))  Lipid panel     Status: Abnormal   Collection Time: 09/22/15 12:00 AM  Result Value Ref Range   Triglycerides 151 40 - 160 mg/dL   Cholesterol 241 (A) 0 - 200 mg/dL   HDL 54 35 - 70 mg/dL   LDL Cholesterol 30 mg/dL  Hemoglobin A1c     Status: None   Collection  Time: 09/22/15 12:00 AM  Result Value Ref Range   Hemoglobin A1C 6.1    LDL was 154 not 30  PHQ2/9: Depression screen [facility] 2/9 10/28/2015 07/30/2015 04/13/2015 01/28/2015 08/08/2014  Decreased Interest 0 0 0 0 0  Down, Depressed, Hopeless 0 0 0 0 0  PHQ - 2 Score 0 0 0 0 0     Fall Risk: Fall Risk  10/28/2015 07/30/2015 04/13/2015 01/28/2015 08/08/2014  Falls in the past year? No No No No No     Functional Status Survey: Is the patient deaf or have difficulty hearing?: No Does the patient have difficulty seeing, even when wearing glasses/contacts?: No Does the patient have difficulty concentrating, remembering,  or making decisions?: No Does the patient have difficulty walking or climbing stairs?: No Does the patient have difficulty dressing or bathing?: No Does the patient have difficulty doing errands alone such as visiting a doctor's office or shopping?: No    Assessment & Plan  1. Acquired hypothyroidism  - levothyroxine (SYNTHROID, LEVOTHROID) 25 MCG tablet; Take 1-2 tablets (25-50 mcg total) by mouth daily before breakfast. One daily and two on Sunday  Dispense: 100 tablet; Refill: 1  2. Needs flu shot  - Flu Vaccine QUAD 36+ mos PF IM (Fluarix & Fluzone Quad PF)  3. Dyslipidemia  LDL was 157, but ASCVD is 3.8% in the next 10 years and she does not need statin therapy   4. Menopause  We will stop clonidine since she has not noticed improvement of symptoms   5. Barrett's esophagus without dysplasia  Discussed PPI use, but because of Barrett's advised to follow up with GI before she stops it  6. Dysmetabolic syndrome  Last [id number] was 6, discussed diabetic diet  7. Perennial allergic rhinitis  Resume Loratadine  8. Obesity (BMI 30-39.9)  - Ambulatory referral to Sleep Studies  9. Snoring  - Ambulatory referral to Sleep Studies Wakes up during the night, snoring, fatigue, we will schedule sleep study

## 2015-10-29 ENCOUNTER — Encounter: Payer: Self-pay | Admitting: Family Medicine

## 2016-02-20 ENCOUNTER — Other Ambulatory Visit: Payer: Self-pay | Admitting: Family Medicine

## 2016-02-20 DIAGNOSIS — J3089 Other allergic rhinitis: Secondary | ICD-10-CM

## 2016-02-29 ENCOUNTER — Ambulatory Visit: Payer: BLUE CROSS/BLUE SHIELD | Admitting: Family Medicine

## 2016-03-08 ENCOUNTER — Ambulatory Visit (INDEPENDENT_AMBULATORY_CARE_PROVIDER_SITE_OTHER): Payer: BLUE CROSS/BLUE SHIELD | Admitting: Family Medicine

## 2016-03-08 ENCOUNTER — Encounter: Payer: Self-pay | Admitting: Family Medicine

## 2016-03-08 VITALS — BP 110/64 | HR 78 | Temp 98.4°F | Resp 16 | Ht 60.0 in | Wt 161.5 lb

## 2016-03-08 DIAGNOSIS — M65332 Trigger finger, left middle finger: Secondary | ICD-10-CM

## 2016-03-08 DIAGNOSIS — G8929 Other chronic pain: Secondary | ICD-10-CM

## 2016-03-08 DIAGNOSIS — K2270 Barrett's esophagus without dysplasia: Secondary | ICD-10-CM

## 2016-03-08 DIAGNOSIS — E8881 Metabolic syndrome: Secondary | ICD-10-CM

## 2016-03-08 DIAGNOSIS — Z79899 Other long term (current) drug therapy: Secondary | ICD-10-CM | POA: Diagnosis not present

## 2016-03-08 DIAGNOSIS — E039 Hypothyroidism, unspecified: Secondary | ICD-10-CM | POA: Diagnosis not present

## 2016-03-08 DIAGNOSIS — E559 Vitamin D deficiency, unspecified: Secondary | ICD-10-CM | POA: Diagnosis not present

## 2016-03-08 DIAGNOSIS — M25649 Stiffness of unspecified hand, not elsewhere classified: Secondary | ICD-10-CM

## 2016-03-08 DIAGNOSIS — M25561 Pain in right knee: Secondary | ICD-10-CM

## 2016-03-08 DIAGNOSIS — E669 Obesity, unspecified: Secondary | ICD-10-CM

## 2016-03-08 DIAGNOSIS — E785 Hyperlipidemia, unspecified: Secondary | ICD-10-CM | POA: Diagnosis not present

## 2016-03-08 DIAGNOSIS — M25562 Pain in left knee: Secondary | ICD-10-CM

## 2016-03-08 DIAGNOSIS — J3089 Other allergic rhinitis: Secondary | ICD-10-CM

## 2016-03-08 MED ORDER — MONTELUKAST SODIUM 10 MG PO TABS: 10.0000 mg | ORAL_TABLET | Freq: Every day | ORAL | 1 refills | Status: DC

## 2016-03-08 MED ORDER — FLUTICASONE PROPIONATE 50 MCG/ACT NA SUSP: 2.0000 | Freq: Every day | NASAL | 1 refills | Status: DC

## 2016-03-08 NOTE — Progress Notes (Signed)
Name: Colleen Curry   MRN: [id number]    DOB: 1955-11-22   Date:03/08/2016       Progress Note  Subjective  Chief Complaint  Chief Complaint  Patient presents with  . Hyperlipidemia  . Hypothyroidism    HPI  Obesity: She has prediabetes she was started on Metformin in March 2017, it was making her feel more full, but she stopped because of diarrhea and not much change on her weight. She gained a few times since last visit, but she states she has decreased sugar intake ( avoiding sodas and sweet tea) and has decreased bread intake  Dyslipidemia: reviewed labs with patient. ASCVD is low, no need for statin therapy at this time. We will recheck labs  Hypothyroidism: she is taking Synrthroid , she has gained  since last visit, she denies constipation, but has chronic dry skin. Advised to take Synthroid 25 mcg daily and increase to 50 mcg on Sundays. We will recheck labs  AR: she has symptoms year round,worse in Spring and Fall. She states nasal congestion is constant even with medication, discussed referral to allergist, she thinks secondary to her old house and also worse with raw fiber.   Menopause: s/p hysterectomy in 1991 for fibroid tumors, she has been taking Estradiol for many years, we stopped the Estradiol in 2017 year and she is struggling with night sweats and it affects her sleep. She has been taking Clonidine but not making much of a difference, discussed Effexor, but she does not want to try any other medication   Iron deficiency anemia: diagnosed fall 2015 seen by GI. Up to date with EGD and colonoscopy 20105- but advised her to contact Dr. Vira Agar since it has been 3 years . She has a history of Barretts esophagus. . She is eating meat again. Last CBC was normal . She is taking Pantoprazole but only prn and denies heart burn or dysphagia, odynophagia. Discussed long term side effects of PPI  Snoring: she snores at night, wakes up during night - once or twice - wakes up  feeling tired, but denies morning headaches, she did not go have sleep study, discussed risk of untreated sleep apnea   Joint pain: she has noticed worsening of bilateral knee pain and stiffness, also both hands are sore, she works at [employer] and has a physical job. She has also noticed left middle finger gets stuck, there is swelling on hands, but not redness or increase in warmth, pain is present even when has a day off work     Patient Active Problem List   Diagnosis Date Noted  . History of hysterectomy 08/08/2014  . Menopause 08/08/2014  . History of iron deficiency anemia 08/08/2014  . Perennial allergic rhinitis 08/07/2014  . Barrett esophagus 08/07/2014  . Dyslipidemia 08/07/2014  . Family history of diabetes mellitus 08/07/2014  . Esophagitis, reflux 08/07/2014  . Acquired hypothyroidism 08/07/2014  . Dysmetabolic syndrome 123456  . Obesity (BMI 30-39.9) 08/07/2014  . Vitamin D deficiency 08/07/2014  . Tubular adenoma of colon 08/07/2014    Past Surgical History:  Procedure Laterality Date  . ABDOMINAL HYSTERECTOMY      Family History  Problem Relation Age of Onset  . Glaucoma Brother   . Stroke Paternal Grandfather   . Breast cancer Neg Hx     Social History   Social History  . Marital status: Divorced    Spouse name: N/A  . Number of children: N/A  . Years of education: N/A  Occupational History  . Not on file.   Social History Main Topics  . Smoking status: Never Smoker  . Smokeless tobacco: Never Used  . Alcohol use No  . Drug use: No  . Sexual activity: Not on file   Other Topics Concern  . Not on file   Social History Narrative  . No narrative on file     Current Outpatient Prescriptions:  .  aspirin (ASPIRIN ADULT LOW STRENGTH) 81 MG chewable tablet, Chew by mouth., Disp: , Rfl:  .  Biotin 1000 MCG tablet, Take 1 tablet by mouth daily., Disp: , Rfl:  .  fluticasone (FLONASE) 50 MCG/ACT nasal spray, Place 2 sprays into both  nostrils daily., Disp: 48 g, Rfl: 1 .  levothyroxine (SYNTHROID, LEVOTHROID) 25 MCG tablet, Take 1-2 tablets (25-50 mcg total) by mouth daily before breakfast. One daily and two on Sunday, Disp: 100 tablet, Rfl: 1 .  loratadine (CLARITIN) 10 MG tablet, Take by mouth., Disp: , Rfl:  .  montelukast (SINGULAIR) 10 MG tablet, Take 1 tablet (10 mg total) by mouth at bedtime., Disp: 90 tablet, Rfl: 1 .  pantoprazole (PROTONIX) 40 MG tablet, Take 1 tablet (40 mg total) by mouth daily., Disp: 90 tablet, Rfl: 1  Allergies  Allergen Reactions  . Codeine Sulfate Other (See Comments)  . Latex Other (See Comments)     ROS  Constitutional: Negative for fever or weight change.  Respiratory: Negative for cough and shortness of breath.   Cardiovascular: Negative for chest pain or palpitations.  Gastrointestinal: Negative for abdominal pain, no bowel changes.  Musculoskeletal: Negative for gait problem or joint swelling. Hand and knee pain and stiffness.  Skin: Negative for rash.  Neurological: Negative for dizziness or headache.  No other specific complaints in a complete review of systems (except as listed in HPI above).  Objective  Vitals:   03/08/16 0942  BP: 110/64  Pulse: 78  Resp: 16  Temp: 98.4 F (36.9 C)  SpO2: 94%  Weight: 161 lb 8 oz (73.3 kg)  Height: 5' (1.524 m)    Body mass index is 31.54 kg/m.  Physical Exam  Constitutional: Patient appears well-developed and well-nourished. Obese  No distress.  HEENT: head atraumatic, normocephalic, pupils equal and reactive to light,neck supple, throat within normal limits Cardiovascular: Normal rate, regular rhythm and normal heart sounds.  No murmur heard. No BLE edema. Pulmonary/Chest: Effort normal and breath sounds normal. No respiratory distress. Abdominal: Soft.  There is no tenderness. Psychiatric: Patient has a normal mood and affect. behavior is normal. Judgment and thought content normal. Muscular Skeletal: normal knee  exam, trigger finger left middle finger, no signs of synovitis  PHQ2/9: Depression screen [facility] 2/9 03/08/2016 10/28/2015 07/30/2015 04/13/2015 01/28/2015  Decreased Interest 0 0 0 0 0  Down, Depressed, Hopeless 0 0 0 0 0  PHQ - 2 Score 0 0 0 0 0     Fall Risk: Fall Risk  03/08/2016 10/28/2015 07/30/2015 04/13/2015 01/28/2015  Falls in the past year? No No No No No     Functional Status Survey: Is the patient deaf or have difficulty hearing?: No Does the patient have difficulty seeing, even when wearing glasses/contacts?: No Does the patient have difficulty concentrating, remembering, or making decisions?: No Does the patient have difficulty walking or climbing stairs?: No Does the patient have difficulty dressing or bathing?: No Does the patient have difficulty doing errands alone such as visiting a doctor's office or shopping?: No   Assessment & Plan  1. Acquired hypothyroidism  Taking one daily and 2 on Sundays - TSH  2. Dyslipidemia  - Lipid panel  3. Barrett's esophagus without dysplasia  No symptoms, taking medication prn now, advised to keep follow up with GI  4. Dysmetabolic syndrome  - Hemoglobin A1c  5. Obesity (BMI 30-39.9)  - Insulin, fasting  6. Vitamin D deficiency  - VITAMIN D 25 Hydroxy (Vit-D Deficiency, Fractures)  7. Trigger finger, left middle finger  - Ambulatory referral to Orthopedic Surgery  8. Chronic pain of both knees  - Ambulatory referral to Orthopedic Surgery  9. Stiffness of hand joint, unspecified laterality  - Sedimentation rate - C-reactive protein - Rheumatoid Factor  10. Long-term use of high-risk medication  - COMPLETE METABOLIC PANEL WITH GFR - CBC with Differential/Platelet  11. Perennial allergic rhinitis  - montelukast (SINGULAIR) 10 MG tablet; Take 1 tablet (10 mg total) by mouth at bedtime.  Dispense: 90 tablet; Refill: 1 - fluticasone (FLONASE) 50 MCG/ACT nasal spray; Place 2 sprays into both nostrils daily.   Dispense: 48 g; Refill: 1

## 2016-03-11 LAB — LIPID PANEL
Cholesterol: 219 mg/dL — AB (ref 0–200)
HDL: 56 mg/dL (ref 35–70)
LDL CALC: 143 mg/dL
Triglycerides: 99 mg/dL (ref 40–160)

## 2016-03-11 LAB — TSH: TSH: 2.2 u[IU]/mL (ref 0.41–5.90)

## 2016-03-11 LAB — BASIC METABOLIC PANEL: GLUCOSE: 109 mg/dL

## 2016-03-11 LAB — HEMOGLOBIN A1C: Hemoglobin A1C: 6.4

## 2016-03-14 ENCOUNTER — Other Ambulatory Visit: Payer: Self-pay | Admitting: Family Medicine

## 2016-03-14 ENCOUNTER — Encounter: Payer: Self-pay | Admitting: Family Medicine

## 2016-03-14 DIAGNOSIS — R7989 Other specified abnormal findings of blood chemistry: Secondary | ICD-10-CM | POA: Insufficient documentation

## 2016-03-14 DIAGNOSIS — M2241 Chondromalacia patellae, right knee: Secondary | ICD-10-CM | POA: Diagnosis not present

## 2016-03-14 DIAGNOSIS — M2242 Chondromalacia patellae, left knee: Secondary | ICD-10-CM | POA: Insufficient documentation

## 2016-03-14 DIAGNOSIS — R768 Other specified abnormal immunological findings in serum: Secondary | ICD-10-CM | POA: Insufficient documentation

## 2016-03-14 DIAGNOSIS — M6530 Trigger finger, unspecified finger: Secondary | ICD-10-CM | POA: Insufficient documentation

## 2016-03-16 ENCOUNTER — Telehealth: Payer: Self-pay | Admitting: Family Medicine

## 2016-03-16 NOTE — Telephone Encounter (Signed)
[facility] IS NEEDING THE LAB THAT THE CCP ANTIOBODY IS POSITIVE. PLEASE FAX TO [phone]

## 2016-03-17 ENCOUNTER — Encounter: Payer: Self-pay | Admitting: Family Medicine

## 2016-03-17 NOTE — Telephone Encounter (Signed)
The requested lab results has been faxed to [facility] - Rheumatology Dept at [phone].  Confirmation was received.

## 2016-03-29 DIAGNOSIS — R768 Other specified abnormal immunological findings in serum: Secondary | ICD-10-CM | POA: Diagnosis not present

## 2016-03-29 DIAGNOSIS — M79642 Pain in left hand: Secondary | ICD-10-CM | POA: Diagnosis not present

## 2016-03-29 DIAGNOSIS — M2550 Pain in unspecified joint: Secondary | ICD-10-CM | POA: Diagnosis not present

## 2016-03-29 DIAGNOSIS — M79641 Pain in right hand: Secondary | ICD-10-CM | POA: Diagnosis not present

## 2016-03-29 DIAGNOSIS — M25642 Stiffness of left hand, not elsewhere classified: Secondary | ICD-10-CM | POA: Diagnosis not present

## 2016-03-29 DIAGNOSIS — M25641 Stiffness of right hand, not elsewhere classified: Secondary | ICD-10-CM | POA: Diagnosis not present

## 2016-05-18 ENCOUNTER — Encounter: Payer: Self-pay | Admitting: Family Medicine

## 2016-05-18 ENCOUNTER — Ambulatory Visit (INDEPENDENT_AMBULATORY_CARE_PROVIDER_SITE_OTHER): Payer: BLUE CROSS/BLUE SHIELD | Admitting: Family Medicine

## 2016-05-18 VITALS — BP 118/62 | HR 83 | Temp 98.3°F | Resp 16 | Ht 60.0 in | Wt 159.4 lb

## 2016-05-18 DIAGNOSIS — Z01419 Encounter for gynecological examination (general) (routine) without abnormal findings: Secondary | ICD-10-CM

## 2016-05-18 DIAGNOSIS — L659 Nonscarring hair loss, unspecified: Secondary | ICD-10-CM

## 2016-05-18 DIAGNOSIS — E669 Obesity, unspecified: Secondary | ICD-10-CM

## 2016-05-18 DIAGNOSIS — N6322 Unspecified lump in the left breast, upper inner quadrant: Secondary | ICD-10-CM | POA: Diagnosis not present

## 2016-05-18 NOTE — Progress Notes (Signed)
Name: Colleen Curry   MRN: [id number]    DOB: Aug 19, 1955   Date:05/18/2016       Progress Note  Subjective  Chief Complaint  Chief Complaint  Patient presents with  . Annual Exam    HPI  Well Woman: she is up to date with colonoscopy and mammogram, she had hysterectomy and no longer needs pap smear. She denies urinary incontinence, unless she has a cold with severe cough. Discussed Kegel exercises.   Obesity: with increase in insulin resistance and hgbA1C of 6.4%, she changed her diet, seeing dietician at work, also exercising, discussed GLP-1 agonist and she will verify with family members if there is a family history of thyroid cancer. She denies personal history of pancreatitis.  Alopecia: she has a long history of hair loss, seen by Dermatologist and told it was fungal but medication did not work, no rashes on scalp, but has bald spot   Patient Active Problem List   Diagnosis Date Noted  . Cyclic citrullinated peptide (CCP) antibody positive 03/14/2016  . History of hysterectomy 08/08/2014  . Menopause 08/08/2014  . History of iron deficiency anemia 08/08/2014  . Perennial allergic rhinitis 08/07/2014  . Barrett esophagus 08/07/2014  . Dyslipidemia 08/07/2014  . Family history of diabetes mellitus 08/07/2014  . Esophagitis, reflux 08/07/2014  . Acquired hypothyroidism 08/07/2014  . Dysmetabolic syndrome 16/01/930  . Obesity (BMI 30-39.9) 08/07/2014  . Vitamin D deficiency 08/07/2014  . Tubular adenoma of colon 08/07/2014    Past Surgical History:  Procedure Laterality Date  . ABDOMINAL HYSTERECTOMY      Family History  Problem Relation Age of Onset  . Glaucoma Brother   . Stroke Paternal Grandfather   . Breast cancer Neg Hx     Social History   Social History  . Marital status: Divorced    Spouse name: N/A  . Number of children: N/A  . Years of education: N/A   Occupational History  . Not on file.   Social History Main Topics  . Smoking status: Never  Smoker  . Smokeless tobacco: Never Used  . Alcohol use No  . Drug use: No  . Sexual activity: Not on file   Other Topics Concern  . Not on file   Social History Narrative  . No narrative on file     Current Outpatient Prescriptions:  .  meloxicam (MOBIC) 7.5 MG tablet, 1 tab as needed with food once a day for joint pain, Disp: , Rfl:  .  aspirin (ASPIRIN ADULT LOW STRENGTH) 81 MG chewable tablet, Chew by mouth., Disp: , Rfl:  .  Biotin 1000 MCG tablet, Take 1 tablet by mouth daily., Disp: , Rfl:  .  fluticasone (FLONASE) 50 MCG/ACT nasal spray, Place 2 sprays into both nostrils daily., Disp: 48 g, Rfl: 1 .  levothyroxine (SYNTHROID, LEVOTHROID) 25 MCG tablet, Take 1-2 tablets (25-50 mcg total) by mouth daily before breakfast. One daily and two on Sunday, Disp: 100 tablet, Rfl: 1 .  loratadine (CLARITIN) 10 MG tablet, Take by mouth., Disp: , Rfl:  .  montelukast (SINGULAIR) 10 MG tablet, Take 1 tablet (10 mg total) by mouth at bedtime., Disp: 90 tablet, Rfl: 1 .  pantoprazole (PROTONIX) 40 MG tablet, Take 1 tablet (40 mg total) by mouth daily., Disp: 90 tablet, Rfl: 1  Allergies  Allergen Reactions  . Codeine Sulfate Other (See Comments)  . Latex Other (See Comments)     ROS  Constitutional: Negative for fever or weight change.  Respiratory: Negative for cough and shortness of breath.   Cardiovascular: Negative for chest pain or palpitations.  Gastrointestinal: Negative for abdominal pain, no bowel changes.  Musculoskeletal: Positive for gait problem or joint swelling.  Skin: Negative for rash.  Neurological: Negative for dizziness or headache.  No other specific complaints in a complete review of systems (except as listed in HPI above).  Objective  Vitals:   05/18/16 0819  BP: 118/62  Pulse: 83  Resp: 16  Temp: 98.3 F (36.8 C)  SpO2: 97%  Weight: 159 lb 6 oz (72.3 kg)  Height: 5' (1.524 m)    Body mass index is 31.13 kg/m.  Physical Exam  Constitutional:  Patient appears well-developed and obesity.  No distress.  HENT: Head: Normocephalic and atraumatic. Ears: B TMs ok, no erythema or effusion; Nose: Nose normal. Mouth/Throat: Oropharynx is clear and moist. No oropharyngeal exudate.  Eyes: Conjunctivae and EOM are normal. Pupils are equal, round, and reactive to light. No scleral icterus.  Neck: Normal range of motion. Neck supple. No JVD present. No thyromegaly present.  Cardiovascular: Normal rate, regular rhythm and normal heart sounds.  No murmur heard. No BLE edema. Pulmonary/Chest: Effort normal and breath sounds normal. No respiratory distress. Abdominal: Soft. Bowel sounds are normal, no distension. There is no tenderness. no masses Breast: pea size mass at 11 o'clock position left breast, tender during palpation,  no nipple discharge or rashes FEMALE GENITALIA:  External genitalia normal External urethra normal RECTAL: not done Musculoskeletal: Normal range of motion, no joint effusions. No gross deformities Neurological: he is alert and oriented to person, place, and time. No cranial nerve deficit. Coordination, balance, strength, speech and gait are normal.  Skin: Skin is warm and dry. No rash noted. No erythema. Hypertrichosis, she also has alopecia - seen by Dermatologist in the past, but she did not have a good experience  Psychiatric: Patient has a normal mood and affect. behavior is normal. Judgment and thought content normal.  Recent Results (from the past 2160 hour(s))  Basic metabolic panel     Status: None   Collection Time: 03/11/16 12:00 AM  Result Value Ref Range   Glucose 109 mg/dL  Lipid panel     Status: Abnormal   Collection Time: 03/11/16 12:00 AM  Result Value Ref Range   Triglycerides 99 40 - 160 mg/dL   Cholesterol 219 (A) 0 - 200 mg/dL   HDL 56 35 - 70 mg/dL   LDL Cholesterol 143 mg/dL  Hemoglobin A1c     Status: None   Collection Time: 03/11/16 12:00 AM  Result Value Ref Range   Hemoglobin A1C 6.4    TSH     Status: None   Collection Time: 03/11/16 12:00 AM  Result Value Ref Range   TSH 2.20 0.41 - 5.90 uIU/mL     PHQ2/9: Depression screen [facility] 2/9 05/18/2016 03/08/2016 10/28/2015 07/30/2015 04/13/2015  Decreased Interest 0 0 0 0 0  Down, Depressed, Hopeless 0 0 0 0 0  PHQ - 2 Score 0 0 0 0 0    Fall Risk: Fall Risk  05/18/2016 03/08/2016 10/28/2015 07/30/2015 04/13/2015  Falls in the past year? No No No No No     Assessment & Plan  1. Well woman exam  Discussed importance of 150 minutes of physical activity weekly, eat two servings of fish weekly, eat one serving of tree nuts ( cashews, pistachios, pecans, almonds.Marland Kitchen) every other day, eat 6 servings of fruit/vegetables daily and drink plenty of water  and avoid sweet beverages.   2. Obesity (BMI 30-39.9)  Discussed with the patient the risk posed by an increased BMI. Discussed importance of portion control, calorie counting and at least 150 minutes of physical activity weekly. Avoid sweet beverages and drink more water. Eat at least 6 servings of fruit and vegetables daily   3. Alopecia  - Ambulatory referral to Dermatology  4. Breast lump on left side at 11 o'clock position  - MM Digital Diagnostic Unilat L; Future - MM Digital Diagnostic Unilat L

## 2016-05-18 NOTE — Patient Instructions (Addendum)
Please check GLP-1 agonist to help lose weight. Options are : Ozempic, Victoza or Trulicity  Preventive Care 40-64 Years, Female Preventive care refers to lifestyle choices and visits with your health care provider that can promote health and wellness. What does preventive care include?  A yearly physical exam. This is also called an annual well check.  Dental exams once or twice a year.  Routine eye exams. Ask your health care provider how often you should have your eyes checked.  Personal lifestyle choices, including:  Daily care of your teeth and gums.  Regular physical activity.  Eating a healthy diet.  Avoiding tobacco and drug use.  Limiting alcohol use.  Practicing safe sex.  Taking low-dose aspirin daily starting at age 89.  Taking vitamin and mineral supplements as recommended by your health care provider. What happens during an annual well check? The services and screenings done by your health care provider during your annual well check will depend on your age, overall health, lifestyle risk factors, and family history of disease. Counseling  Your health care provider may ask you questions about your:  Alcohol use.  Tobacco use.  Drug use.  Emotional well-being.  Home and relationship well-being.  Sexual activity.  Eating habits.  Work and work Statistician.  Method of birth control.  Menstrual cycle.  Pregnancy history. Screening  You may have the following tests or measurements:  Height, weight, and BMI.  Blood pressure.  Lipid and cholesterol levels. These may be checked every 5 years, or more frequently if you are over 30 years old.  Skin check.  Lung cancer screening. You may have this screening every year starting at age 33 if you have a 30-pack-year history of smoking and currently smoke or have quit within the past 15 years.  Fecal occult blood test (FOBT) of the stool. You may have this test every year starting at age  74.  Flexible sigmoidoscopy or colonoscopy. You may have a sigmoidoscopy every 5 years or a colonoscopy every 10 years starting at age 55.  Hepatitis C blood test.  Hepatitis B blood test.  Sexually transmitted disease (STD) testing.  Diabetes screening. This is done by checking your blood sugar (glucose) after you have not eaten for a while (fasting). You may have this done every 1-3 years.  Mammogram. This may be done every 1-2 years. Talk to your health care provider about when you should start having regular mammograms. This may depend on whether you have a family history of breast cancer.  BRCA-related cancer screening. This may be done if you have a family history of breast, ovarian, tubal, or peritoneal cancers.  Pelvic exam and Pap test. This may be done every 3 years starting at age 33. Starting at age 72, this may be done every 5 years if you have a Pap test in combination with an HPV test.  Bone density scan. This is done to screen for osteoporosis. You may have this scan if you are at high risk for osteoporosis. Discuss your test results, treatment options, and if necessary, the need for more tests with your health care provider. Vaccines  Your health care provider may recommend certain vaccines, such as:  Influenza vaccine. This is recommended every year.  Tetanus, diphtheria, and acellular pertussis (Tdap, Td) vaccine. You may need a Td booster every 10 years.  Varicella vaccine. You may need this if you have not been vaccinated.  Zoster vaccine. You may need this after age 16.  Measles, mumps, and  rubella (MMR) vaccine. You may need at least one dose of MMR if you were born in 1957 or later. You may also need a second dose.  Pneumococcal 13-valent conjugate (PCV13) vaccine. You may need this if you have certain conditions and were not previously vaccinated.  Pneumococcal polysaccharide (PPSV23) vaccine. You may need one or two doses if you smoke cigarettes or if you  have certain conditions.  Meningococcal vaccine. You may need this if you have certain conditions.  Hepatitis A vaccine. You may need this if you have certain conditions or if you travel or work in places where you may be exposed to hepatitis A.  Hepatitis B vaccine. You may need this if you have certain conditions or if you travel or work in places where you may be exposed to hepatitis B.  Haemophilus influenzae type b (Hib) vaccine. You may need this if you have certain conditions. Talk to your health care provider about which screenings and vaccines you need and how often you need them. This information is not intended to replace advice given to you by your health care provider. Make sure you discuss any questions you have with your health care provider. Document Released: 02/06/2015 Document Revised: 09/30/2015 Document Reviewed: 11/11/2014 Elsevier Interactive Patient Education  2017 [employer].

## 2016-05-23 ENCOUNTER — Encounter: Payer: Self-pay | Admitting: Family Medicine

## 2016-05-23 DIAGNOSIS — L648 Other androgenic alopecia: Secondary | ICD-10-CM | POA: Diagnosis not present

## 2016-05-26 ENCOUNTER — Other Ambulatory Visit: Payer: Self-pay

## 2016-05-26 DIAGNOSIS — Z1231 Encounter for screening mammogram for malignant neoplasm of breast: Secondary | ICD-10-CM

## 2016-05-26 NOTE — Progress Notes (Signed)
[id number]  

## 2016-05-30 ENCOUNTER — Other Ambulatory Visit: Payer: Self-pay

## 2016-05-30 ENCOUNTER — Other Ambulatory Visit: Payer: Self-pay | Admitting: Family Medicine

## 2016-05-30 DIAGNOSIS — N6322 Unspecified lump in the left breast, upper inner quadrant: Secondary | ICD-10-CM

## 2016-05-30 DIAGNOSIS — E039 Hypothyroidism, unspecified: Secondary | ICD-10-CM

## 2016-05-30 NOTE — Telephone Encounter (Signed)
Patient requesting refill of Levothyroxine to [facility].

## 2016-06-15 ENCOUNTER — Other Ambulatory Visit: Payer: Self-pay

## 2016-06-15 DIAGNOSIS — Z1231 Encounter for screening mammogram for malignant neoplasm of breast: Secondary | ICD-10-CM

## 2016-06-30 DIAGNOSIS — E785 Hyperlipidemia, unspecified: Secondary | ICD-10-CM | POA: Diagnosis not present

## 2016-06-30 DIAGNOSIS — M0600 Rheumatoid arthritis without rheumatoid factor, unspecified site: Secondary | ICD-10-CM | POA: Diagnosis not present

## 2016-06-30 DIAGNOSIS — R768 Other specified abnormal immunological findings in serum: Secondary | ICD-10-CM | POA: Diagnosis not present

## 2016-06-30 DIAGNOSIS — Z79899 Other long term (current) drug therapy: Secondary | ICD-10-CM | POA: Diagnosis not present

## 2016-07-04 LAB — POCT ERYTHROCYTE SEDIMENTATION RATE, NON-AUTOMATED: Sed Rate: 22

## 2016-07-04 LAB — HEPATIC FUNCTION PANEL
ALK PHOS: 84 (ref 25–125)
ALT: 20 (ref 7–35)
AST: 17 (ref 13–35)

## 2016-07-04 LAB — CBC AND DIFFERENTIAL: WBC: 18.1

## 2016-07-06 ENCOUNTER — Encounter: Payer: Self-pay | Admitting: Family Medicine

## 2016-07-07 ENCOUNTER — Encounter: Payer: Self-pay | Admitting: Family Medicine

## 2016-07-20 DIAGNOSIS — L648 Other androgenic alopecia: Secondary | ICD-10-CM | POA: Diagnosis not present

## 2016-08-02 DIAGNOSIS — E785 Hyperlipidemia, unspecified: Secondary | ICD-10-CM | POA: Diagnosis not present

## 2016-08-02 DIAGNOSIS — E119 Type 2 diabetes mellitus without complications: Secondary | ICD-10-CM | POA: Diagnosis not present

## 2016-08-02 DIAGNOSIS — Z79899 Other long term (current) drug therapy: Secondary | ICD-10-CM | POA: Diagnosis not present

## 2016-08-02 DIAGNOSIS — M0600 Rheumatoid arthritis without rheumatoid factor, unspecified site: Secondary | ICD-10-CM | POA: Diagnosis not present

## 2016-08-15 ENCOUNTER — Ambulatory Visit
Admission: RE | Admit: 2016-08-15 | Discharge: 2016-08-15 | Disposition: A | Discharge status: Home / Self Care | Payer: BLUE CROSS/BLUE SHIELD | Source: Ambulatory Visit | Attending: Family Medicine | Admitting: Family Medicine

## 2016-08-15 DIAGNOSIS — Z1231 Encounter for screening mammogram for malignant neoplasm of breast: Secondary | ICD-10-CM | POA: Diagnosis not present

## 2016-09-01 ENCOUNTER — Other Ambulatory Visit: Payer: Self-pay | Admitting: Family Medicine

## 2016-09-01 DIAGNOSIS — E039 Hypothyroidism, unspecified: Secondary | ICD-10-CM

## 2016-09-01 NOTE — Telephone Encounter (Signed)
Patient requesting refill of Levothyroxine to [facility].

## 2016-09-06 LAB — BASIC METABOLIC PANEL
Creatinine: 1 (ref 0.5–1.1)
Glucose: 123
Potassium: 4.2 (ref 3.4–5.3)
Sodium: 141 (ref 137–147)

## 2016-09-06 LAB — HEPATIC FUNCTION PANEL
ALT: 20 (ref 7–35)
AST: 23 (ref 13–35)

## 2016-09-06 LAB — LIPID PANEL
CHOLESTEROL: 244 — AB (ref 0–200)
HDL: 58 (ref 35–70)
LDL CALC: 166
TRIGLYCERIDES: 100 (ref 40–160)

## 2016-09-06 LAB — CBC AND DIFFERENTIAL
Hemoglobin: 7.2 — AB (ref 12.0–16.0)
Neutrophils Absolute: 4

## 2016-09-07 ENCOUNTER — Other Ambulatory Visit: Payer: Self-pay | Admitting: Family Medicine

## 2016-09-08 ENCOUNTER — Ambulatory Visit (INDEPENDENT_AMBULATORY_CARE_PROVIDER_SITE_OTHER): Payer: BLUE CROSS/BLUE SHIELD | Admitting: Family Medicine

## 2016-09-08 ENCOUNTER — Encounter: Payer: Self-pay | Admitting: Family Medicine

## 2016-09-08 VITALS — BP 120/67 | HR 80 | Temp 97.7°F | Resp 16 | Ht 60.0 in | Wt 158.4 lb

## 2016-09-08 DIAGNOSIS — E785 Hyperlipidemia, unspecified: Secondary | ICD-10-CM | POA: Diagnosis not present

## 2016-09-08 DIAGNOSIS — K2270 Barrett's esophagus without dysplasia: Secondary | ICD-10-CM | POA: Diagnosis not present

## 2016-09-08 DIAGNOSIS — E039 Hypothyroidism, unspecified: Secondary | ICD-10-CM

## 2016-09-08 DIAGNOSIS — J3089 Other allergic rhinitis: Secondary | ICD-10-CM | POA: Diagnosis not present

## 2016-09-08 DIAGNOSIS — E8881 Metabolic syndrome: Secondary | ICD-10-CM

## 2016-09-08 DIAGNOSIS — E119 Type 2 diabetes mellitus without complications: Secondary | ICD-10-CM | POA: Diagnosis not present

## 2016-09-08 DIAGNOSIS — L659 Nonscarring hair loss, unspecified: Secondary | ICD-10-CM

## 2016-09-08 MED ORDER — ROSUVASTATIN CALCIUM 10 MG PO TABS: 10.0000 mg | ORAL_TABLET | Freq: Every day | ORAL | 0 refills | Status: DC

## 2016-09-08 MED ORDER — MONTELUKAST SODIUM 10 MG PO TABS: 10.0000 mg | ORAL_TABLET | Freq: Every day | ORAL | 1 refills | Status: DC

## 2016-09-08 MED ORDER — CLOBETASOL PROPIONATE 0.05 % EX LIQD: Freq: Every day | CUTANEOUS | 0 refills | Status: DC

## 2016-09-08 MED ORDER — LEVOTHYROXINE SODIUM 25 MCG PO TABS: 25.0000 ug | ORAL_TABLET | Freq: Every day | ORAL | 0 refills | Status: DC

## 2016-09-08 MED ORDER — PANTOPRAZOLE SODIUM 40 MG PO TBEC: 40.0000 mg | DELAYED_RELEASE_TABLET | Freq: Every day | ORAL | 1 refills | Status: DC

## 2016-09-08 NOTE — Patient Instructions (Signed)
Diabetes Mellitus and Food It is important for you to manage your blood sugar (glucose) level. Your blood glucose level can be greatly affected by what you eat. Eating healthier foods in the appropriate amounts throughout the day at about the same time each day will help you control your blood glucose level. It can also help slow or prevent worsening of your diabetes mellitus. Healthy eating may even help you improve the level of your blood pressure and reach or maintain a healthy weight. General recommendations for healthful eating and cooking habits include:  Eating meals and snacks regularly. Avoid going long periods of time without eating to lose weight.  Eating a diet that consists mainly of plant-based foods, such as fruits, vegetables, nuts, legumes, and whole grains.  Using low-heat cooking methods, such as baking, instead of high-heat cooking methods, such as deep frying.  Work with your dietitian to make sure you understand how to use the Nutrition Facts information on food labels. How can food affect me? Carbohydrates Carbohydrates affect your blood glucose level more than any other type of food. Your dietitian will help you determine how many carbohydrates to eat at each meal and teach you how to count carbohydrates. Counting carbohydrates is important to keep your blood glucose at a healthy level, especially if you are using insulin or taking certain medicines for diabetes mellitus. Alcohol Alcohol can cause sudden decreases in blood glucose (hypoglycemia), especially if you use insulin or take certain medicines for diabetes mellitus. Hypoglycemia can be a life-threatening condition. Symptoms of hypoglycemia (sleepiness, dizziness, and disorientation) are similar to symptoms of having too much alcohol. If your health care provider has given you approval to drink alcohol, do so in moderation and use the following guidelines:  Women should not have more than one drink per day, and men  should not have more than two drinks per day. One drink is equal to: ? 12 oz of beer. ? 5 oz of wine. ? 1 oz of hard liquor.  Do not drink on an empty stomach.  Keep yourself hydrated. Have water, diet soda, or unsweetened iced tea.  Regular soda, juice, and other mixers might contain a lot of carbohydrates and should be counted.  What foods are not recommended? As you make food choices, it is important to remember that all foods are not the same. Some foods have fewer nutrients per serving than other foods, even though they might have the same number of calories or carbohydrates. It is difficult to get your body what it needs when you eat foods with fewer nutrients. Examples of foods that you should avoid that are high in calories and carbohydrates but low in nutrients include:  Trans fats (most processed foods list trans fats on the Nutrition Facts label).  Regular soda.  Juice.  Candy.  Sweets, such as cake, pie, doughnuts, and cookies.  Fried foods.  What foods can I eat? Eat nutrient-rich foods, which will nourish your body and keep you healthy. The food you should eat also will depend on several factors, including:  The calories you need.  The medicines you take.  Your weight.  Your blood glucose level.  Your blood pressure level.  Your cholesterol level.  You should eat a variety of foods, including:  Protein. ? Lean cuts of meat. ? Proteins low in saturated fats, such as fish, egg whites, and beans. Avoid processed meats.  Fruits and vegetables. ? Fruits and vegetables that may help control blood glucose levels, such as apples,   mangoes, and yams.  Dairy products. ? Choose fat-free or low-fat dairy products, such as milk, yogurt, and cheese.  Grains, bread, pasta, and rice. ? Choose whole grain products, such as multigrain bread, whole oats, and brown rice. These foods may help control blood pressure.  Fats. ? Foods containing healthful fats, such as  nuts, avocado, olive oil, canola oil, and fish.  Does everyone with diabetes mellitus have the same meal plan? Because every person with diabetes mellitus is different, there is not one meal plan that works for everyone. It is very important that you meet with a dietitian who will help you create a meal plan that is just right for you. This information is not intended to replace advice given to you by your health care provider. Make sure you discuss any questions you have with your health care provider. Document Released: 10/07/2004 Document Revised: 06/18/2015 Document Reviewed: 12/07/2012 Elsevier Interactive Patient Education  2017 [employer]  

## 2016-09-08 NOTE — Progress Notes (Signed)
Name: Colleen Curry   MRN: [id number]    DOB: 09-05-1955   Date:09/08/2016       Progress Note  Subjective  Chief Complaint  Chief Complaint  Patient presents with  . Follow-up    6 mo  . Medication Refill  . Hypothyroidism  . Hyperlipidemia  . Gastroesophageal Reflux    HPI  New onset DM: she has a history of pre-diabetes, she is seeing dietician at work to help her lose weight, had labs done this week and fasting glucose was 123 and hgbA1C 7.2% , she episodes of moodiness, polyphagia, polydipsia and polyuria. She has family history of DM ( 3 aunts, 2 uncles, brother, sister, daughter and maternal grandmother).   Obesity:  she changed her diet, seeing dietician at work, also exercising, discussed GLP-1 agonist, she denies family history of thyroid cancer.  She denies personal history of pancreatitis.  Alopecia: she has a long history of hair loss, seen by Dermatologist and is now on topical steroids.  RA: seeing Dr. Meda Coffee, diagnosed in Feb, but started on Methotrexate March 2018, she seems to be tolerating medication, hand stiffness and swelling has improved, still feeling tired.   Hypothyroidism: taking 25 mcg daily and 50 mcg on Sundays, TSH still elevated, she is tired, unable to lose weight, we will adjust dose and recheck in 6-8 weeks.  Barretts Esophagus: controlled with pantoprazole, anemia has resolved, off supplementation now  Dyslipidemia: LDL is higher and with new onset diabetes, we will start her on statin therapy. Continue 81 mg aspirin daily    Patient Active Problem List   Diagnosis Date Noted  . Seronegative rheumatoid arthritis ([place]) 06/30/2016  . Cyclic citrullinated peptide (CCP) antibody positive 03/14/2016  . Chondromalacia of both patellae 03/14/2016  . Acquired trigger finger 03/14/2016  . History of hysterectomy 08/08/2014  . Menopause 08/08/2014  . History of iron deficiency anemia 08/08/2014  . Perennial allergic rhinitis 08/07/2014  . Barrett  esophagus 08/07/2014  . Dyslipidemia 08/07/2014  . Family history of diabetes mellitus 08/07/2014  . Esophagitis, reflux 08/07/2014  . Acquired hypothyroidism 08/07/2014  . Dysmetabolic syndrome 16/57/9038  . Obesity (BMI 30-39.9) 08/07/2014  . Vitamin D deficiency 08/07/2014  . Tubular adenoma of colon 08/07/2014    Past Surgical History:  Procedure Laterality Date  . ABDOMINAL HYSTERECTOMY      Family History  Problem Relation Age of Onset  . Glaucoma Brother   . Stroke Paternal Grandfather   . Breast cancer Neg Hx     Social History   Social History  . Marital status: Divorced    Spouse name: N/A  . Number of children: N/A  . Years of education: N/A   Occupational History  . Not on file.   Social History Main Topics  . Smoking status: Never Smoker  . Smokeless tobacco: Never Used  . Alcohol use No  . Drug use: No  . Sexual activity: Not on file   Other Topics Concern  . Not on file   Social History Narrative  . No narrative on file     Current Outpatient Prescriptions:  .  aspirin (ASPIRIN ADULT LOW STRENGTH) 81 MG chewable tablet, Chew by mouth., Disp: , Rfl:  .  fluticasone (FLONASE) 50 MCG/ACT nasal spray, Place 2 sprays into both nostrils daily., Disp: 48 g, Rfl: 1 .  folic acid (FOLVITE) 1 MG tablet, Take 1 tablet by mouth daily., Disp: , Rfl:  .  levothyroxine (SYNTHROID, LEVOTHROID) 25 MCG tablet, Take 1-2 tablets (  25-50 mcg total) by mouth daily before breakfast. Alternate one pill and two pills every other day, Disp: 135 tablet, Rfl: 0 .  loratadine (CLARITIN) 10 MG tablet, Take by mouth., Disp: , Rfl:  .  methotrexate (RHEUMATREX) 2.5 MG tablet, Take 2.5 mg by mouth once a week., Disp: , Rfl:  .  montelukast (SINGULAIR) 10 MG tablet, Take 1 tablet (10 mg total) by mouth at bedtime., Disp: 90 tablet, Rfl: 1 .  pantoprazole (PROTONIX) 40 MG tablet, Take 1 tablet (40 mg total) by mouth daily., Disp: 90 tablet, Rfl: 1 .  Biotin 1000 MCG tablet, Take  1 tablet by mouth daily., Disp: , Rfl:  .  Clobetasol Propionate (TEMOVATE) 0.05 % external spray, Apply topically daily. Daily on scalp, Disp: 59 mL, Rfl: 0 .  rosuvastatin (CRESTOR) 10 MG tablet, Take 1 tablet (10 mg total) by mouth daily., Disp: 90 tablet, Rfl: 0  Allergies  Allergen Reactions  . Codeine Sulfate Other (See Comments)  . Latex Other (See Comments)     ROS  Constitutional: Negative for fever or weight change.  Respiratory: Negative for cough and shortness of breath.   Cardiovascular: Negative for chest pain or palpitations.  Gastrointestinal: Negative for abdominal pain, no bowel changes.  Musculoskeletal: Negative for gait problem or joint swelling.  Skin: Negative for rash.  Neurological: Positive for dizziness, and headache.  No other specific complaints in a complete review of systems (except as listed in HPI above).  Objective  Vitals:   09/08/16 0810  BP: 120/67  Pulse: 80  Resp: 16  Temp: 97.7 F (36.5 C)  TempSrc: Oral  SpO2: 97%  Weight: 158 lb 6.4 oz (71.8 kg)  Height: 5' (1.524 m)    Body mass index is 30.94 kg/m.  Physical Exam  Constitutional: Patient appears well-developed and well-nourished. Obese No distress.  HEENT: head atraumatic, normocephalic, pupils equal and reactive to light,neck supple, throat within normal limits Cardiovascular: Normal rate, regular rhythm and normal heart sounds.  No murmur heard. No BLE edema. Pulmonary/Chest: Effort normal and breath sounds normal. No respiratory distress. Abdominal: Soft.  There is no tenderness. Psychiatric: Patient has a normal mood and affect. behavior is normal. Judgment and thought content normal.  Recent Results (from the past 2160 hour(s))  CBC and differential     Status: None   Collection Time: 07/04/16 12:00 AM  Result Value Ref Range   WBC 18.1   POCT erythrocyte sed rate, Non-automated     Status: None   Collection Time: 07/04/16 12:00 AM  Result Value Ref Range   Sed  Rate 22   Hepatic function panel     Status: None   Collection Time: 07/04/16 12:00 AM  Result Value Ref Range   Alkaline Phosphatase 84 25 - 125   ALT 20 7 - 35   AST 17 13 - 35  CBC and differential     Status: Abnormal   Collection Time: 09/06/16 12:00 AM  Result Value Ref Range   Hemoglobin 7.2 (A) 12.0 - 16.0   Neutrophils Absolute 4   Basic metabolic panel     Status: None   Collection Time: 09/06/16 12:00 AM  Result Value Ref Range   Glucose 123    Creatinine 1.0 0.5 - 1.1   Potassium 4.2 3.4 - 5.3   Sodium 141 137 - 147  Lipid panel     Status: Abnormal   Collection Time: 09/06/16 12:00 AM  Result Value Ref Range   Triglycerides 100  40 - 160   Cholesterol 244 (A) 0 - 200   HDL 58 35 - 70   LDL Cholesterol 166   Hepatic function panel     Status: None   Collection Time: 09/06/16 12:00 AM  Result Value Ref Range   ALT 20 7 - 35   AST 23 13 - 35    Diabetic Foot Exam: Diabetic Foot Exam - Simple   Simple Foot Form Diabetic Foot exam was performed with the following findings:  Yes 09/08/2016  8:47 AM  Visual Inspection No deformities, no ulcerations, no other skin breakdown bilaterally:  Yes Sensation Testing Intact to touch and monofilament testing bilaterally:  Yes Pulse Check Posterior Tibialis and Dorsalis pulse intact bilaterally:  Yes Comments      PHQ2/9: Depression screen [facility] 2/9 09/08/2016 05/18/2016 03/08/2016 10/28/2015 07/30/2015  Decreased Interest 0 0 0 0 0  Down, Depressed, Hopeless 0 0 0 0 0  PHQ - 2 Score 0 0 0 0 0     Fall Risk: Fall Risk  09/08/2016 05/18/2016 03/08/2016 10/28/2015 07/30/2015  Falls in the past year? No No No No No     Functional Status Survey: Is the patient deaf or have difficulty hearing?: No Does the patient have difficulty seeing, even when wearing glasses/contacts?: Yes (glasses) Does the patient have difficulty concentrating, remembering, or making decisions?: No Does the patient have difficulty walking or climbing  stairs?: No Does the patient have difficulty dressing or bathing?: No Does the patient have difficulty doing errands alone such as visiting a doctor's office or shopping?: No    Assessment & Plan  1. Diabetes mellitus, new onset ([place])  She would like to try diet only, she has a dietician at work, discussed importance of life style modification  2. Perennial allergic rhinitis  - montelukast (SINGULAIR) 10 MG tablet; Take 1 tablet (10 mg total) by mouth at bedtime.  Dispense: 90 tablet; Refill: 1  3. Acquired hypothyroidism  - levothyroxine (SYNTHROID, LEVOTHROID) 25 MCG tablet; Take 1-2 tablets (25-50 mcg total) by mouth daily before breakfast. Alternate one pill and two pills every other day  Dispense: 135 tablet; Refill: 0  4. Dyslipidemia  - rosuvastatin (CRESTOR) 10 MG tablet; Take 1 tablet (10 mg total) by mouth daily.  Dispense: 90 tablet; Refill: 0  5. Barrett's esophagus without dysplasia  - pantoprazole (PROTONIX) 40 MG tablet; Take 1 tablet (40 mg total) by mouth daily.  Dispense: 90 tablet; Refill: 1  6. Dysmetabolic syndrome  Discussed life style modification  7. Alopecia  - Clobetasol Propionate (TEMOVATE) 0.05 % external spray; Apply topically daily. Daily on scalp  Dispense: 59 mL; Refill: 0

## 2016-09-09 ENCOUNTER — Encounter: Payer: Self-pay | Admitting: Family Medicine

## 2016-10-31 DIAGNOSIS — M65311 Trigger thumb, right thumb: Secondary | ICD-10-CM | POA: Diagnosis not present

## 2016-11-01 DIAGNOSIS — Z79899 Other long term (current) drug therapy: Secondary | ICD-10-CM | POA: Diagnosis not present

## 2016-11-01 DIAGNOSIS — R768 Other specified abnormal immunological findings in serum: Secondary | ICD-10-CM | POA: Diagnosis not present

## 2016-11-01 DIAGNOSIS — M0600 Rheumatoid arthritis without rheumatoid factor, unspecified site: Secondary | ICD-10-CM | POA: Diagnosis not present

## 2016-12-07 ENCOUNTER — Ambulatory Visit (INDEPENDENT_AMBULATORY_CARE_PROVIDER_SITE_OTHER): Payer: BLUE CROSS/BLUE SHIELD | Admitting: Family Medicine

## 2016-12-07 ENCOUNTER — Telehealth: Payer: Self-pay | Admitting: Family Medicine

## 2016-12-07 ENCOUNTER — Encounter: Payer: Self-pay | Admitting: Family Medicine

## 2016-12-07 VITALS — BP 130/84 | HR 80 | Resp 12 | Ht 60.0 in | Wt 154.9 lb

## 2016-12-07 DIAGNOSIS — K2270 Barrett's esophagus without dysplasia: Secondary | ICD-10-CM

## 2016-12-07 DIAGNOSIS — E039 Hypothyroidism, unspecified: Secondary | ICD-10-CM

## 2016-12-07 DIAGNOSIS — R51 Headache: Secondary | ICD-10-CM | POA: Diagnosis not present

## 2016-12-07 DIAGNOSIS — J3089 Other allergic rhinitis: Secondary | ICD-10-CM

## 2016-12-07 DIAGNOSIS — Z23 Encounter for immunization: Secondary | ICD-10-CM | POA: Diagnosis not present

## 2016-12-07 DIAGNOSIS — M0600 Rheumatoid arthritis without rheumatoid factor, unspecified site: Secondary | ICD-10-CM | POA: Diagnosis not present

## 2016-12-07 DIAGNOSIS — E119 Type 2 diabetes mellitus without complications: Secondary | ICD-10-CM

## 2016-12-07 DIAGNOSIS — E785 Hyperlipidemia, unspecified: Secondary | ICD-10-CM | POA: Diagnosis not present

## 2016-12-07 DIAGNOSIS — E669 Obesity, unspecified: Secondary | ICD-10-CM

## 2016-12-07 DIAGNOSIS — R519 Headache, unspecified: Secondary | ICD-10-CM

## 2016-12-07 DIAGNOSIS — E559 Vitamin D deficiency, unspecified: Secondary | ICD-10-CM

## 2016-12-07 LAB — POCT GLYCOSYLATED HEMOGLOBIN (HGB A1C): HEMOGLOBIN A1C: 6.4

## 2016-12-07 LAB — POCT UA - MICROALBUMIN
Albumin/Creatinine Ratio, Urine, POC: NEGATIVE
Creatinine, POC: NEGATIVE mg/dL
MICROALBUMIN (UR) POC: NEGATIVE mg/L

## 2016-12-07 MED ORDER — ROSUVASTATIN CALCIUM 10 MG PO TABS: 10.0000 mg | ORAL_TABLET | Freq: Every day | ORAL | 0 refills | Status: DC

## 2016-12-07 MED ORDER — BUTALBITAL-APAP-CAFFEINE 50-325-40 MG PO TABS: 1.0000 | ORAL_TABLET | Freq: Four times a day (QID) | ORAL | 0 refills | Status: DC | PRN

## 2016-12-07 MED ORDER — LEVOTHYROXINE SODIUM 25 MCG PO TABS: 25.0000 ug | ORAL_TABLET | Freq: Every day | ORAL | 0 refills | Status: DC

## 2016-12-07 NOTE — Telephone Encounter (Signed)
Copied from [place] #[id number]. Topic: Inquiry >> Dec 07, 2016  1:09 PM Oliver Pila B wrote: Reason for CRM: PT said that she forgot to ask about her Rx of Effort being refilled today and is wanting to have it refilled, call pt if needed or have pharmacy contact pt when ready

## 2016-12-07 NOTE — Progress Notes (Signed)
Name: Colleen Curry   MRN: [id number]    DOB: 1955/10/09   Date:12/07/2016       Progress Note  Subjective  Chief Complaint  Chief Complaint  Patient presents with  . Diabetes  . Hyperlipidemia  . Hypothyroidism    HPI  DM II:  she had a long history of metabolic syndrome, she had labs done in August that showed  fasting glucose was 123 and hgbA1C 7.2% , she was having episodes of moodiness, polyphagia, polydipsia and polyuria, that started June 2018 after placed on prednisone by Dr. Sabra Heck ( ortho)  She has family history of DM ( 3 aunts, 2 uncles, brother, sister, daughter and maternal grandmother). She is on diet only, seen by dietician and hgbA1C down to 6.4%  Obesity:  she changed her diet, seeing dietician at work, also exercising, discussed GLP-1 agonist, she denies family history of thyroid cancer.  She denies personal history of pancreatitis.She is not interested on medications at this time  Alopecia: she has a long history of hair loss, seen by Dermatologist and is now on topical steroids.  RA: seeing Dr. Meda Coffee, diagnosed in Feb, but started on Methotrexate March 2018, she seems to be tolerating medication, hand stiffness and swelling has resolved, fatigue is not as significant and is intermittent.   Headaches: she states she has recurrent headaches, started in her 34's, described as gradual onset, throbbing like, it can be temporal, frontal or nuchal area, when intense she has nausea and sound sensitivity, denies aura, brother has a history of migraine. She states it can last more than one day, no photophobia.   Hypothyroidism: taking 25 mcg daily and 50 mcg on Sundays, TSH was high, we will recheck labs  Barretts Esophagus: controlled with pantoprazole, anemia has resolved, off supplementation now  Dyslipidemia: LDL iwas 166 we will recheck today, started on Crestor 08/2016, . Continue 81 mg aspirin daily    Patient Active Problem List   Diagnosis Date Noted  .  Seronegative rheumatoid arthritis ([place]) 06/30/2016  . Cyclic citrullinated peptide (CCP) antibody positive 03/14/2016  . Chondromalacia of both patellae 03/14/2016  . Acquired trigger finger 03/14/2016  . History of hysterectomy 08/08/2014  . Menopause 08/08/2014  . History of iron deficiency anemia 08/08/2014  . Perennial allergic rhinitis 08/07/2014  . Barrett esophagus 08/07/2014  . Dyslipidemia 08/07/2014  . Family history of diabetes mellitus 08/07/2014  . Esophagitis, reflux 08/07/2014  . Acquired hypothyroidism 08/07/2014  . Dysmetabolic syndrome 70/48/8891  . Obesity (BMI 30-39.9) 08/07/2014  . Vitamin D deficiency 08/07/2014  . Tubular adenoma of colon 08/07/2014    Past Surgical History:  Procedure Laterality Date  . ABDOMINAL HYSTERECTOMY      Family History  Problem Relation Age of Onset  . Glaucoma Brother   . Stroke Paternal Grandfather   . Breast cancer Neg Hx     Social History   Socioeconomic History  . Marital status: Divorced    Spouse name: Not on file  . Number of children: Not on file  . Years of education: Not on file  . Highest education level: Not on file  Social Needs  . Financial resource strain: Not on file  . Food insecurity - worry: Not on file  . Food insecurity - inability: Not on file  . Transportation needs - medical: Not on file  . Transportation needs - non-medical: Not on file  Occupational History  . Not on file  Tobacco Use  . Smoking status: Never Smoker  .  Smokeless tobacco: Never Used  Substance and Sexual Activity  . Alcohol use: No    Alcohol/week: 0.0 oz  . Drug use: No  . Sexual activity: Not on file  Other Topics Concern  . Not on file  Social History Narrative  . Not on file     Current Outpatient Medications:  .  aspirin (ASPIRIN ADULT LOW STRENGTH) 81 MG chewable tablet, Chew by mouth., Disp: , Rfl:  .  Biotin 1000 MCG tablet, Take 1 tablet by mouth daily., Disp: , Rfl:  .  Clobetasol Propionate  (TEMOVATE) 0.05 % external spray, Apply topically daily. Daily on scalp, Disp: 59 mL, Rfl: 0 .  fluticasone (FLONASE) 50 MCG/ACT nasal spray, Place 2 sprays into both nostrils daily., Disp: 48 g, Rfl: 1 .  folic acid (FOLVITE) 1 MG tablet, Take 1 tablet by mouth daily., Disp: , Rfl:  .  levothyroxine (SYNTHROID, LEVOTHROID) 25 MCG tablet, Take 1-2 tablets (25-50 mcg total) by mouth daily before breakfast. Alternate one pill and two pills every other day, Disp: 135 tablet, Rfl: 0 .  loratadine (CLARITIN) 10 MG tablet, Take by mouth., Disp: , Rfl:  .  methotrexate (RHEUMATREX) 2.5 MG tablet, Take 2.5 mg by mouth once a week., Disp: , Rfl:  .  montelukast (SINGULAIR) 10 MG tablet, Take 1 tablet (10 mg total) by mouth at bedtime., Disp: 90 tablet, Rfl: 1 .  pantoprazole (PROTONIX) 40 MG tablet, Take 1 tablet (40 mg total) by mouth daily., Disp: 90 tablet, Rfl: 1 .  rosuvastatin (CRESTOR) 10 MG tablet, Take 1 tablet (10 mg total) by mouth daily., Disp: 90 tablet, Rfl: 0  Allergies  Allergen Reactions  . Prednisone Other (See Comments)    Elevated blood pressure, glucose spiked, mood swings  . Codeine Sulfate Other (See Comments)  . Latex Other (See Comments)     ROS   Constitutional: Negative for fever , mild weight change.  Respiratory: Negative for cough and shortness of breath.   Cardiovascular: Negative for chest pain or palpitations.  Gastrointestinal: Negative for abdominal pain, no bowel changes.  Musculoskeletal: Negative for gait problem or joint swelling.  Skin: Negative for rash.  Neurological: Negative for dizziness , positive for intermittent  headache.  No other specific complaints in a complete review of systems (except as listed in HPI above).  Objective  Vitals:   12/07/16 0810  BP: 130/84  Pulse: 80  Resp: 12  SpO2: 98%  Weight: 154 lb 14.4 oz (70.3 kg)  Height: 5' (1.524 m)    Body mass index is 30.25 kg/m.  Physical Exam  Constitutional: Patient appears  well-developed and well-nourished. Obese  No distress.  HEENT: head atraumatic, normocephalic, pupils equal and reactive to light,  neck supple, throat within normal limits Cardiovascular: Normal rate, regular rhythm and normal heart sounds.  No murmur heard. No BLE edema. Pulmonary/Chest: [place] normal and breath sounds normal. No respiratory distress. Abdominal: Soft.  There is no tenderness. Psychiatric: Patient has a normal mood and affect. behavior is normal. Judgment and thought content normal. Muscular skeletal: no synovitis.   Recent Results (from the past 2160 hour(s))  POCT UA - Microalbumin     Status: Normal   Collection Time: 12/07/16  8:17 AM  Result Value Ref Range   Microalbumin Ur, POC Negative mg/L   Creatinine, POC Negative mg/dL   Albumin/Creatinine Ratio, Urine, POC Negative   POCT HgB A1C     Status: Abnormal   Collection Time: 12/07/16  8:18 AM  Result Value Ref Range   Hemoglobin A1C 6.4       PHQ2/9: Depression screen [facility] 2/9 09/08/2016 05/18/2016 03/08/2016 10/28/2015 07/30/2015  Decreased Interest 0 0 0 0 0  Down, Depressed, Hopeless 0 0 0 0 0  PHQ - 2 Score 0 0 0 0 0     Fall Risk: Fall Risk  12/07/2016 09/08/2016 05/18/2016 03/08/2016 10/28/2015  Falls in the past year? No No No No No     Functional Status Survey: Is the patient deaf or have difficulty hearing?: No Does the patient have difficulty seeing, even when wearing glasses/contacts?: No Does the patient have difficulty concentrating, remembering, or making decisions?: No Does the patient have difficulty walking or climbing stairs?: No Does the patient have difficulty dressing or bathing?: No Does the patient have difficulty doing errands alone such as visiting a doctor's office or shopping?: No    Assessment & Plan  1. Diabetes mellitus, new onset ([place])  Diagnosed 08/2016 with hgbA1C of 7.2% - POCT HgB A1C - 6.4%  - POCT UA - Microalbumin - negative -com panel   2. Perennial allergic  rhinitis  Continue nasal steroid and loratadine  3. Acquired hypothyroidism  - levothyroxine (SYNTHROID, LEVOTHROID) 25 MCG tablet; Take 1-2 tablets (25-50 mcg total) daily before breakfast by mouth. Alternate one pill and two pills every other day  Dispense: 135 tablet; Refill: 0 - TSH  4. Dyslipidemia  - rosuvastatin (CRESTOR) 10 MG tablet; Take 1 tablet (10 mg total) daily by mouth.  Dispense: 90 tablet; Refill: 0 - Lipid panel  5. Barrett's esophagus without dysplasia  Continue PPI daily and follow up with GI  6. Obesity (BMI 30-39.9)  Lost 4 lbs since last visit  7. Vitamin D deficiency  Continue supplementation   8. Headache, unspecified headache type  - butalbital-acetaminophen-caffeine (FIORICET, ESGIC) 50-325-40 MG tablet; Take 1-2 tablets every 6 (six) hours as needed by mouth for headache.  Dispense: 30 tablet; Refill: 0  9. Seronegative rheumatoid arthritis ([place])  Seeing Dr. Meda Coffee and is doing better with methotrexate.   10. Need for vaccination for pneumococcus  - Pneumococcal polysaccharide vaccine 23-valent greater than or equal to 2yo subcutaneous/IM

## 2016-12-07 NOTE — Telephone Encounter (Signed)
Pt notified has refills

## 2016-12-14 LAB — BASIC METABOLIC PANEL
BUN: 9 (ref 4–21)
Creatinine: 1 (ref 0.5–1.1)
GLUCOSE: 115
Potassium: 3.9 (ref 3.4–5.3)
SODIUM: 145 (ref 137–147)

## 2016-12-14 LAB — HEPATIC FUNCTION PANEL
ALT: 27 (ref 7–35)
AST: 24 (ref 13–35)

## 2016-12-14 LAB — LIPID PANEL
Cholesterol: 178 (ref 0–200)
HDL: 63 (ref 35–70)
LDL CALC: 91
TRIGLYCERIDES: 122 (ref 40–160)

## 2016-12-14 LAB — TSH: TSH: 2.79 (ref 0.41–5.90)

## 2016-12-19 ENCOUNTER — Encounter: Payer: Self-pay | Admitting: Family Medicine

## 2016-12-21 ENCOUNTER — Encounter: Payer: Self-pay | Admitting: Family Medicine

## 2017-01-27 ENCOUNTER — Other Ambulatory Visit: Payer: Self-pay | Admitting: Family Medicine

## 2017-01-27 DIAGNOSIS — L659 Nonscarring hair loss, unspecified: Secondary | ICD-10-CM

## 2017-01-27 NOTE — Telephone Encounter (Signed)
Refill request for general medication. Clobetasol to [facility].   Last office visit: 12/07/2016   Follow up  03/15/2017

## 2017-02-02 DIAGNOSIS — L648 Other androgenic alopecia: Secondary | ICD-10-CM | POA: Diagnosis not present

## 2017-03-06 ENCOUNTER — Other Ambulatory Visit: Payer: Self-pay | Admitting: Family Medicine

## 2017-03-06 DIAGNOSIS — Z79899 Other long term (current) drug therapy: Secondary | ICD-10-CM | POA: Diagnosis not present

## 2017-03-06 DIAGNOSIS — E785 Hyperlipidemia, unspecified: Secondary | ICD-10-CM

## 2017-03-06 DIAGNOSIS — R768 Other specified abnormal immunological findings in serum: Secondary | ICD-10-CM | POA: Diagnosis not present

## 2017-03-06 DIAGNOSIS — M0600 Rheumatoid arthritis without rheumatoid factor, unspecified site: Secondary | ICD-10-CM | POA: Diagnosis not present

## 2017-03-06 NOTE — Telephone Encounter (Signed)
Refill Request for Cholesterol medication. Rosuvastatin to [facility]  Last visit: 12/07/2017   Lab Results  Component Value Date   CHOL 178 12/14/2016   HDL 63 12/14/2016   LDLCALC 91 12/14/2016   TRIG 122 12/14/2016    Follow up on 03/15/17

## 2017-03-15 ENCOUNTER — Encounter: Payer: Self-pay | Admitting: Family Medicine

## 2017-03-15 ENCOUNTER — Ambulatory Visit (INDEPENDENT_AMBULATORY_CARE_PROVIDER_SITE_OTHER): Payer: BLUE CROSS/BLUE SHIELD | Admitting: Family Medicine

## 2017-03-15 VITALS — BP 120/80 | HR 78 | Resp 16 | Ht 60.0 in | Wt 155.0 lb

## 2017-03-15 DIAGNOSIS — E119 Type 2 diabetes mellitus without complications: Secondary | ICD-10-CM

## 2017-03-15 DIAGNOSIS — E785 Hyperlipidemia, unspecified: Secondary | ICD-10-CM

## 2017-03-15 DIAGNOSIS — J3089 Other allergic rhinitis: Secondary | ICD-10-CM | POA: Diagnosis not present

## 2017-03-15 DIAGNOSIS — E669 Obesity, unspecified: Secondary | ICD-10-CM

## 2017-03-15 DIAGNOSIS — R519 Headache, unspecified: Secondary | ICD-10-CM

## 2017-03-15 DIAGNOSIS — E039 Hypothyroidism, unspecified: Secondary | ICD-10-CM | POA: Diagnosis not present

## 2017-03-15 DIAGNOSIS — M0600 Rheumatoid arthritis without rheumatoid factor, unspecified site: Secondary | ICD-10-CM | POA: Diagnosis not present

## 2017-03-15 DIAGNOSIS — R51 Headache: Secondary | ICD-10-CM | POA: Diagnosis not present

## 2017-03-15 DIAGNOSIS — E559 Vitamin D deficiency, unspecified: Secondary | ICD-10-CM | POA: Diagnosis not present

## 2017-03-15 LAB — POCT GLYCOSYLATED HEMOGLOBIN (HGB A1C): Hemoglobin A1C: 5.9

## 2017-03-15 MED ORDER — LEVOTHYROXINE SODIUM 25 MCG PO TABS: 25.0000 ug | ORAL_TABLET | Freq: Every day | ORAL | 0 refills | Status: DC

## 2017-03-15 MED ORDER — MONTELUKAST SODIUM 10 MG PO TABS: 10.0000 mg | ORAL_TABLET | Freq: Every day | ORAL | 1 refills | Status: DC

## 2017-03-15 MED ORDER — BUTALBITAL-APAP-CAFFEINE 50-325-40 MG PO TABS: 1.0000 | ORAL_TABLET | Freq: Four times a day (QID) | ORAL | 0 refills | Status: DC | PRN

## 2017-03-15 MED ORDER — ROSUVASTATIN CALCIUM 10 MG PO TABS: 10.0000 mg | ORAL_TABLET | Freq: Every day | ORAL | 0 refills | Status: DC

## 2017-03-15 MED ORDER — FLUTICASONE PROPIONATE 50 MCG/ACT NA SUSP: 2.0000 | Freq: Every day | NASAL | 1 refills | Status: DC

## 2017-03-15 NOTE — Progress Notes (Signed)
Name: Colleen Curry   MRN: [id number]    DOB: 04/09/1955   Date:03/15/2017       Progress Note  Subjective  Chief Complaint  Chief Complaint  Patient presents with  . Medication Refill  . Diabetes    Does not check sugar at home  . Obesity    Seen Dietician at work   . Alopecia  . Hypothyroidism    Dry Skin  . Barretts Esophagus    Controlled with daily medication  . Dyslipidemia    HPI  DM II:  she had a long history of metabolic syndrome, August 2018 that showed  fasting glucose was 123 and hgbA1C 7.2% , she was having episodes of moodiness, polyphagia, polydipsia and polyuria, that started June 2018 after placed on prednisone by Dr. Sabra Heck ( ortho)  She has family history of DM ( 3 aunts, 2 uncles, brother, sister, daughter and maternal grandmother).She is on diet only, seen by dietician and hgbA1C went  6.4% in 3 months and now is down to 5.9% and is doing great. She stopped eating pasta and breads, avoid sweets. Advised yearly eye exam  Obesity: she changed her diet, seeing dietician at work, also exercising, discussed GLP-1 agonist, she denies family history of thyroid cancer.She denies personal history of pancreatitis.She is not interested on medications at this time. Weight is stable  Alopecia: she has a long history of hair loss, seen by Dermatologist and is now on topical steroids.  AR: she has symptoms year round, worse at work with fiber and dust, but stable with medication   RA: seeing Dr. Meda Coffee, diagnosed in Feb, but started on Methotrexate March 2018, she seems to be tolerating medication, she has hand stiffness in the mornings, but swelling has resolved, fatigue is not as significant and is intermittent.   Headaches: she states she has recurrent headaches, started in her 47's, described as gradual onset, throbbing like, it can be temporal, frontal or nuchal area, when intense she has nausea and sound sensitivity, denies aura, brother has a history of migraine.  She states it can last more than one day, no photophobia. Responding to Fioricet prn, she has episodes at most once a week, but none over the past month  Hypothyroidism: taking 25 mcg daily alternating with 50 mcg every other day, last TSH was back to normal   Barretts Esophagus: controlled with pantoprazole, she denies heartburn or dysphagia  Dyslipidemia: LDL was 166 and went down to 91 after she started  Crestor 08/2016 . Continue 81 mg aspirin daily    Patient Active Problem List   Diagnosis Date Noted  . Seronegative rheumatoid arthritis ([place]) 06/30/2016  . Cyclic citrullinated peptide (CCP) antibody positive 03/14/2016  . Chondromalacia of both patellae 03/14/2016  . Acquired trigger finger 03/14/2016  . History of hysterectomy 08/08/2014  . Menopause 08/08/2014  . History of iron deficiency anemia 08/08/2014  . Perennial allergic rhinitis 08/07/2014  . Barrett esophagus 08/07/2014  . Dyslipidemia 08/07/2014  . Family history of diabetes mellitus 08/07/2014  . Esophagitis, reflux 08/07/2014  . Acquired hypothyroidism 08/07/2014  . Dysmetabolic syndrome 26/94/8546  . Obesity (BMI 30-39.9) 08/07/2014  . Vitamin D deficiency 08/07/2014  . Tubular adenoma of colon 08/07/2014    Past Surgical History:  Procedure Laterality Date  . ABDOMINAL HYSTERECTOMY      Family History  Problem Relation Age of Onset  . Glaucoma Brother   . Stroke Paternal Grandfather   . Thyroid disease Father   . Heart disease  Father   . Seizures Father   . Diabetes Sister   . Hypertension Sister   . Breast cancer Neg Hx     Social History   Socioeconomic History  . Marital status: Divorced    Spouse name: Not on file  . Number of children: Not on file  . Years of education: Not on file  . Highest education level: Not on file  Social Needs  . Financial resource strain: Not on file  . Food insecurity - worry: Not on file  . Food insecurity - inability: Not on file  .  Transportation needs - medical: Not on file  . Transportation needs - non-medical: Not on file  Occupational History  . Not on file  Tobacco Use  . Smoking status: Never Smoker  . Smokeless tobacco: Never Used  Substance and Sexual Activity  . Alcohol use: No    Alcohol/week: 0.0 oz  . Drug use: No  . Sexual activity: Yes    Partners: Male  Other Topics Concern  . Not on file  Social History Narrative  . Not on file     Current Outpatient Medications:  .  aspirin (ASPIRIN ADULT LOW STRENGTH) 81 MG chewable tablet, Chew by mouth., Disp: , Rfl:  .  Biotin 1000 MCG tablet, Take 1 tablet by mouth daily., Disp: , Rfl:  .  butalbital-acetaminophen-caffeine (FIORICET, ESGIC) 50-325-40 MG tablet, Take 1-2 tablets every 6 (six) hours as needed by mouth for headache., Disp: 30 tablet, Rfl: 0 .  Clobetasol Propionate (TEMOVATE) 0.05 % external spray, APPLY EVERY DAY ON SCALP, Disp: 59 mL, Rfl: 0 .  fluticasone (FLONASE) 50 MCG/ACT nasal spray, Place 2 sprays into both nostrils daily., Disp: 48 g, Rfl: 1 .  folic acid (FOLVITE) 1 MG tablet, Take 1 tablet by mouth daily., Disp: , Rfl:  .  levothyroxine (SYNTHROID, LEVOTHROID) 25 MCG tablet, Take 1-2 tablets (25-50 mcg total) daily before breakfast by mouth. Alternate one pill and two pills every other day, Disp: 135 tablet, Rfl: 0 .  loratadine (CLARITIN) 10 MG tablet, Take by mouth., Disp: , Rfl:  .  methotrexate (RHEUMATREX) 2.5 MG tablet, Take 2.5 mg by mouth once a week., Disp: , Rfl:  .  montelukast (SINGULAIR) 10 MG tablet, Take 1 tablet (10 mg total) by mouth at bedtime., Disp: 90 tablet, Rfl: 1 .  pantoprazole (PROTONIX) 40 MG tablet, Take 1 tablet (40 mg total) by mouth daily., Disp: 90 tablet, Rfl: 1 .  rosuvastatin (CRESTOR) 10 MG tablet, TAKE ONE TABLET BY MOUTH EVERY DAY, Disp: 90 tablet, Rfl: 0  Allergies  Allergen Reactions  . Prednisone Other (See Comments)    Elevated blood pressure, glucose spiked, mood swings  . Codeine  Sulfate Other (See Comments)  . Latex Other (See Comments)     ROS  Constitutional: Negative for fever or weight change.  Respiratory: Negative for cough and shortness of breath.   Cardiovascular: Negative for chest pain or palpitations.  Gastrointestinal: Negative for abdominal pain, no bowel changes.  Musculoskeletal: Negative for gait problem or  joint swelling.  Skin: Negative for rash.  Neurological: Negative for dizziness or headache.  No other specific complaints in a complete review of systems (except as listed in HPI above).  Objective  Vitals:   03/15/17 1043  BP: 120/80  Pulse: 78  Resp: 16  SpO2: 97%  Weight: 155 lb (70.3 kg)  Height: 5' (1.524 m)    Body mass index is 30.27 kg/m.  Physical Exam  Constitutional: Patient appears well-developed and well-nourished. Obese  No distress.  HEENT: head atraumatic, normocephalic, pupils equal and reactive to light,  neck supple, throat within normal limits Cardiovascular: Normal rate, regular rhythm and normal heart sounds.  No murmur heard. No BLE edema. Pulmonary/Chest: Effort normal and breath sounds normal. No respiratory distress. Abdominal: Soft.  There is no tenderness. Psychiatric: Patient has a normal mood and affect. behavior is normal. Judgment and thought content normal. Muscular Skeletal: right thumb is extension, having OT   Recent Results (from the past 2160 hour(s))  POCT HgB A1C     Status: Normal   Collection Time: 03/15/17 11:20 AM  Result Value Ref Range   Hemoglobin A1C 5.9       PHQ2/9: Depression screen [facility] 2/9 03/15/2017 09/08/2016 05/18/2016 03/08/2016 10/28/2015  Decreased Interest 0 0 0 0 0  Down, Depressed, Hopeless 1 0 0 0 0  PHQ - 2 Score 1 0 0 0 0     Fall Risk: Fall Risk  03/15/2017 12/07/2016 09/08/2016 05/18/2016 03/08/2016  Falls in the past year? No No No No No     Functional Status Survey: Is the patient deaf or have difficulty hearing?: No Does the patient have  difficulty seeing, even when wearing glasses/contacts?: No Does the patient have difficulty concentrating, remembering, or making decisions?: No Does the patient have difficulty walking or climbing stairs?: No Does the patient have difficulty dressing or bathing?: No Does the patient have difficulty doing errands alone such as visiting a doctor's office or shopping?: No    Assessment & Plan  1. Type 2 diabetes, diet controlled ([facility])  - POCT HgB A1C  2. Acquired hypothyroidism  - levothyroxine (SYNTHROID, LEVOTHROID) 25 MCG tablet; Take 1-2 tablets (25-50 mcg total) by mouth daily before breakfast. Alternate one pill and two pills every other day  Dispense: 135 tablet; Refill: 0  3. Dyslipidemia  - rosuvastatin (CRESTOR) 10 MG tablet; Take 1 tablet (10 mg total) by mouth daily.  Dispense: 90 tablet; Refill: 0  4. Vitamin D deficiency   5. Perennial allergic rhinitis  - montelukast (SINGULAIR) 10 MG tablet; Take 1 tablet (10 mg total) by mouth at bedtime.  Dispense: 90 tablet; Refill: 1 - fluticasone (FLONASE) 50 MCG/ACT nasal spray; Place 2 sprays into both nostrils daily.  Dispense: 48 g; Refill: 1  6. Obesity (BMI 30-39.9)  Discussed with the patient the risk posed by an increased BMI. Discussed importance of portion control, calorie counting and at least 150 minutes of physical activity weekly. Avoid sweet beverages and drink more water. Eat at least 6 servings of fruit and vegetables daily   7. Seronegative rheumatoid arthritis ([place])  Continue follow up with Rheumatologist   8. Headache, unspecified headache type  - butalbital-acetaminophen-caffeine (FIORICET, ESGIC) 50-325-40 MG tablet; Take 1-2 tablets by mouth every 6 (six) hours as needed for headache.  Dispense: 30 tablet; Refill: 0

## 2017-05-05 DIAGNOSIS — L648 Other androgenic alopecia: Secondary | ICD-10-CM | POA: Diagnosis not present

## 2017-06-01 LAB — HEMOGLOBIN A1C: Hemoglobin A1C: 6.1

## 2017-06-01 LAB — VITAMIN D 25 HYDROXY (VIT D DEFICIENCY, FRACTURES): Vit D, 25-Hydroxy: 27.5

## 2017-06-01 LAB — LIPID PANEL
Cholesterol: 154 (ref 0–200)
HDL: 63 (ref 35–70)
LDL CALC: 77
Triglycerides: 69 (ref 40–160)

## 2017-06-01 LAB — TSH: TSH: 3.46 (ref <=5.90)

## 2017-06-01 LAB — BASIC METABOLIC PANEL
Glucose: 91
Sodium: 145 (ref 137–147)

## 2017-06-02 ENCOUNTER — Encounter: Payer: Self-pay | Admitting: Family Medicine

## 2017-06-05 ENCOUNTER — Encounter: Payer: Self-pay | Admitting: Family Medicine

## 2017-06-05 DIAGNOSIS — R7303 Prediabetes: Secondary | ICD-10-CM | POA: Insufficient documentation

## 2017-06-06 ENCOUNTER — Encounter: Payer: Self-pay | Admitting: Family Medicine

## 2017-06-08 DIAGNOSIS — L648 Other androgenic alopecia: Secondary | ICD-10-CM | POA: Diagnosis not present

## 2017-07-14 ENCOUNTER — Encounter: Payer: Self-pay | Admitting: Family Medicine

## 2017-07-14 ENCOUNTER — Ambulatory Visit (INDEPENDENT_AMBULATORY_CARE_PROVIDER_SITE_OTHER): Payer: BLUE CROSS/BLUE SHIELD | Admitting: Family Medicine

## 2017-07-14 ENCOUNTER — Other Ambulatory Visit: Payer: Self-pay | Admitting: Family Medicine

## 2017-07-14 VITALS — BP 114/68 | HR 72 | Temp 98.1°F | Resp 16 | Ht 60.0 in | Wt 150.3 lb

## 2017-07-14 DIAGNOSIS — Z1231 Encounter for screening mammogram for malignant neoplasm of breast: Secondary | ICD-10-CM

## 2017-07-14 DIAGNOSIS — E559 Vitamin D deficiency, unspecified: Secondary | ICD-10-CM

## 2017-07-14 DIAGNOSIS — E119 Type 2 diabetes mellitus without complications: Secondary | ICD-10-CM

## 2017-07-14 DIAGNOSIS — E785 Hyperlipidemia, unspecified: Secondary | ICD-10-CM | POA: Diagnosis not present

## 2017-07-14 DIAGNOSIS — K2270 Barrett's esophagus without dysplasia: Secondary | ICD-10-CM | POA: Diagnosis not present

## 2017-07-14 DIAGNOSIS — J3089 Other allergic rhinitis: Secondary | ICD-10-CM | POA: Diagnosis not present

## 2017-07-14 DIAGNOSIS — E039 Hypothyroidism, unspecified: Secondary | ICD-10-CM

## 2017-07-14 DIAGNOSIS — M0600 Rheumatoid arthritis without rheumatoid factor, unspecified site: Secondary | ICD-10-CM | POA: Diagnosis not present

## 2017-07-14 MED ORDER — MONTELUKAST SODIUM 10 MG PO TABS: 10.0000 mg | ORAL_TABLET | Freq: Every day | ORAL | 1 refills | Status: DC

## 2017-07-14 MED ORDER — LEVOTHYROXINE SODIUM 25 MCG PO TABS: 25.0000 ug | ORAL_TABLET | Freq: Every day | ORAL | 0 refills | Status: DC

## 2017-07-14 MED ORDER — ROSUVASTATIN CALCIUM 10 MG PO TABS: 10.0000 mg | ORAL_TABLET | Freq: Every day | ORAL | 1 refills | Status: DC

## 2017-07-14 NOTE — Progress Notes (Signed)
Name: Colleen Curry   MRN: [id number]    DOB: 1955/04/24   Date:07/14/2017       Progress Note  Subjective  Chief Complaint  Chief Complaint  Patient presents with  . Medication Refill  . Allergic Rhinitis     Watery Eyes  . Dyslipidemia  . Hypothyroidism    Dry Skin  . Headache    Takes medication once a week to control migraine   . Diabetes  . Barretts Esophagus    Takes daily if she forgets she will have a sour taste in her mouth while burping    HPI  DM II: she had a long history of metabolic syndrome, August 2018 that showedfasting glucose was 123 and hgbA1C 7.2% , she was havingepisodes of moodiness, polyphagia, polydipsia and polyuria, that started June 2018 after placed on prednisone by Dr. Sabra Heck ( ortho)She has family history of DM ( 3 aunts, 2 uncles, brother, sister, daughter and maternal grandmother).She is on diet only, seen by dietician and hgbA1C went  6.4% in 3 months , after that  5.9% and 6.1 % in May 2019 . She stopped eating pasta and breads, avoid sweets, she has lost weight since last visit, states too busy to eat, moved to an apartment and goes home to unpack.  Eye exam is scheduled.   Obesity: she changed her diet, seeing dietician at work, also exercising, doing well, losing weight.   Alopecia: she has a long history of hair loss, seen by Dermatologist and is now on topical steroids.  AR: she has symptoms year round, worse at work with fiber and dust, but stable with medication.  She gets itchy and watery eyes.    RA: seeing Dr. Meda Coffee, diagnosed in Feb, but started on Methotrexate March 2018, she seems to be tolerating medication, she has hand stiffness in the mornings, but swelling hasresolved, fatigue is not as significant and is intermittent. Unchanged.   Headaches: she states she has recurrent headaches, started in her 33's, described as gradual onset, throbbing like, it can be temporal, frontal or nuchal area, when intense she has nausea and  sound sensitivity, denies aura, brother has a history of migraine. She states it can last more than one day, no photophobia.Responding to Fioricet prn, she has episodes at most 2 episodes per month. Unchanged.   Hypothyroidism: taking 25 mcg daily alternating with 50 mcg every other day, last TSH normal. Continue medication   Barretts Esophagus: controlled with pantoprazole, she denies heartburn or dysphagia. Unchanged   Dyslipidemia: LDL was 166 and went down to 77  after she started  Crestor 08/2016. Continue 81 mg aspirin daily.    Patient Active Problem List   Diagnosis Date Noted  . Prediabetes 06/05/2017  . Seronegative rheumatoid arthritis ([place]) 06/30/2016  . Cyclic citrullinated peptide (CCP) antibody positive 03/14/2016  . Chondromalacia of both patellae 03/14/2016  . Acquired trigger finger 03/14/2016  . History of hysterectomy 08/08/2014  . Menopause 08/08/2014  . History of iron deficiency anemia 08/08/2014  . Perennial allergic rhinitis 08/07/2014  . Barrett esophagus 08/07/2014  . Dyslipidemia 08/07/2014  . Family history of diabetes mellitus 08/07/2014  . Esophagitis, reflux 08/07/2014  . Acquired hypothyroidism 08/07/2014  . Dysmetabolic syndrome 64/40/3474  . Obesity (BMI 30-39.9) 08/07/2014  . Vitamin D deficiency 08/07/2014  . Tubular adenoma of colon 08/07/2014    Past Surgical History:  Procedure Laterality Date  . ABDOMINAL HYSTERECTOMY      Family History  Problem Relation Age of Onset  .  Glaucoma Brother   . Stroke Paternal Grandfather   . Thyroid disease Father   . Heart disease Father   . Seizures Father   . Diabetes Sister   . Hypertension Sister   . Breast cancer Neg Hx     Social History   Socioeconomic History  . Marital status: Divorced    Spouse name: Not on file  . Number of children: Not on file  . Years of education: Not on file  . Highest education level: Not on file  Occupational History  . Not on file  Social Needs   . Financial resource strain: Not on file  . Food insecurity:    Worry: Not on file    Inability: Not on file  . Transportation needs:    Medical: Not on file    Non-medical: Not on file  Tobacco Use  . Smoking status: Never Smoker  . Smokeless tobacco: Never Used  Substance and Sexual Activity  . Alcohol use: No    Alcohol/week: 0.0 oz  . Drug use: No  . Sexual activity: Yes    Partners: Male  Lifestyle  . Physical activity:    Days per week: Not on file    Minutes per session: Not on file  . Stress: Not on file  Relationships  . Social connections:    Talks on phone: Not on file    Gets together: Not on file    Attends religious service: Not on file    Active member of club or organization: Not on file    Attends meetings of clubs or organizations: Not on file    Relationship status: Not on file  . Intimate partner violence:    Fear of current or ex partner: Not on file    Emotionally abused: Not on file    Physically abused: Not on file    Forced sexual activity: Not on file  Other Topics Concern  . Not on file  Social History Narrative  . Not on file     Current Outpatient Medications:  .  aspirin (ASPIRIN ADULT LOW STRENGTH) 81 MG chewable tablet, Chew by mouth., Disp: , Rfl:  .  Biotin 1000 MCG tablet, Take 1 tablet by mouth daily., Disp: , Rfl:  .  butalbital-acetaminophen-caffeine (FIORICET, ESGIC) 50-325-40 MG tablet, Take 1-2 tablets by mouth every 6 (six) hours as needed for headache., Disp: 30 tablet, Rfl: 0 .  Clobetasol Propionate (TEMOVATE) 0.05 % external spray, APPLY EVERY DAY ON SCALP, Disp: 59 mL, Rfl: 0 .  fluticasone (FLONASE) 50 MCG/ACT nasal spray, Place 2 sprays into both nostrils daily., Disp: 48 g, Rfl: 1 .  levothyroxine (SYNTHROID, LEVOTHROID) 25 MCG tablet, Take 1-2 tablets (25-50 mcg total) by mouth daily before breakfast. Alternate one pill and two pills every other day, Disp: 135 tablet, Rfl: 0 .  methotrexate (RHEUMATREX) 2.5 MG  tablet, Take 2.5 mg by mouth once a week., Disp: , Rfl:  .  montelukast (SINGULAIR) 10 MG tablet, Take 1 tablet (10 mg total) by mouth at bedtime., Disp: 90 tablet, Rfl: 1 .  pantoprazole (PROTONIX) 40 MG tablet, Take 1 tablet (40 mg total) by mouth daily., Disp: 90 tablet, Rfl: 1 .  rosuvastatin (CRESTOR) 10 MG tablet, Take 1 tablet (10 mg total) by mouth daily., Disp: 90 tablet, Rfl: 0 .  loratadine (CLARITIN) 10 MG tablet, Take by mouth., Disp: , Rfl:   Allergies  Allergen Reactions  . Prednisone Other (See Comments)    Elevated blood pressure,  glucose spiked, mood swings  . Codeine Sulfate Other (See Comments)  . Latex Other (See Comments)     ROS  Constitutional: Negative for fever , positive for weight change.  Respiratory: Negative for cough and shortness of breath.   Cardiovascular: Negative for chest pain or palpitations.  Gastrointestinal: Negative for abdominal pain, no bowel changes.  Musculoskeletal: Negative for gait problem or joint swelling.  Skin: Negative for rash.  Neurological: Negative for dizziness or headache.  No other specific complaints in a complete review of systems (except as listed in HPI above).   Objective  Vitals:   07/14/17 0913  BP: 114/68  Pulse: 72  Resp: 16  Temp: 98.1 F (36.7 C)  TempSrc: Oral  SpO2: 98%  Weight: 150 lb 4.8 oz (68.2 kg)  Height: 5' (1.524 m)    Body mass index is 29.35 kg/m.  Physical Exam  Constitutional: Patient appears well-developed and well-nourished. Obese  No distress.  HEENT: head atraumatic, normocephalic, pupils equal and reactive to light, neck supple, throat within normal limits Cardiovascular: Normal rate, regular rhythm and normal heart sounds.  No murmur heard. No BLE edema. Pulmonary/Chest: Effort normal and breath sounds normal. No respiratory distress. Abdominal: Soft.  There is no tenderness. Psychiatric: Patient has a normal mood and affect. behavior is normal. Judgment and thought content  normal.  Recent Results (from the past 2160 hour(s))  VITAMIN D 25 Hydroxy (Vit-D Deficiency, Fractures)     Status: None   Collection Time: 06/01/17 12:00 AM  Result Value Ref Range   Vit D, 25-Hydroxy 08.6   Basic metabolic panel     Status: None   Collection Time: 06/01/17 12:00 AM  Result Value Ref Range   Glucose 91    Sodium 145 137 - 147  Lipid panel     Status: None   Collection Time: 06/01/17 12:00 AM  Result Value Ref Range   Triglycerides 69 40 - 160   Cholesterol 154 0 - 200   HDL 63 35 - 70   LDL Cholesterol 77   Hemoglobin A1c     Status: None   Collection Time: 06/01/17 12:00 AM  Result Value Ref Range   Hemoglobin A1C 6.1   TSH     Status: None   Collection Time: 06/01/17 12:00 AM  Result Value Ref Range   TSH 3.46 0.41 - 5.90      PHQ2/9: Depression screen [facility] 2/9 07/14/2017 03/15/2017 09/08/2016 05/18/2016 03/08/2016  Decreased Interest 0 0 0 0 0  Down, Depressed, Hopeless 0 1 0 0 0  PHQ - 2 Score 0 1 0 0 0     Fall Risk: Fall Risk  07/14/2017 03/15/2017 12/07/2016 09/08/2016 05/18/2016  Falls in the past year? No No No No No     Functional Status Survey: Is the patient deaf or have difficulty hearing?: No Does the patient have difficulty seeing, even when wearing glasses/contacts?: No Does the patient have difficulty concentrating, remembering, or making decisions?: No Does the patient have difficulty walking or climbing stairs?: No Does the patient have difficulty dressing or bathing?: No Does the patient have difficulty doing errands alone such as visiting a doctor's office or shopping?: No    Assessment & Plan  1. Type 2 diabetes, diet controlled ([place])  Continue life style modification   2. Acquired hypothyroidism  - levothyroxine (SYNTHROID, LEVOTHROID) 25 MCG tablet; Take 1-2 tablets (25-50 mcg total) by mouth daily before breakfast. Alternate one pill and two pills every other day  Dispense: 135 tablet; Refill: 0  3. Dyslipidemia  -  rosuvastatin (CRESTOR) 10 MG tablet; Take 1 tablet (10 mg total) by mouth daily.  Dispense: 90 tablet; Refill: 1  4. Vitamin D deficiency   5. Seronegative rheumatoid arthritis ([place])  Continue follow up with rheumatologist   6. Barrett's esophagus without dysplasia  Continue follow up with GI  7. Perennial allergic rhinitis  - montelukast (SINGULAIR) 10 MG tablet; Take 1 tablet (10 mg total) by mouth at bedtime.  Dispense: 90 tablet; Refill: 1

## 2017-07-25 DIAGNOSIS — L648 Other androgenic alopecia: Secondary | ICD-10-CM | POA: Diagnosis not present

## 2017-08-16 ENCOUNTER — Ambulatory Visit
Admission: RE | Admit: 2017-08-16 | Discharge: 2017-08-16 | Disposition: A | Discharge status: Home / Self Care | Payer: BLUE CROSS/BLUE SHIELD | Source: Ambulatory Visit | Attending: Family Medicine | Admitting: Family Medicine

## 2017-08-16 DIAGNOSIS — Z1231 Encounter for screening mammogram for malignant neoplasm of breast: Secondary | ICD-10-CM | POA: Insufficient documentation

## 2017-08-30 DIAGNOSIS — Z79899 Other long term (current) drug therapy: Secondary | ICD-10-CM | POA: Diagnosis not present

## 2017-08-30 DIAGNOSIS — L648 Other androgenic alopecia: Secondary | ICD-10-CM | POA: Diagnosis not present

## 2017-08-30 DIAGNOSIS — R768 Other specified abnormal immunological findings in serum: Secondary | ICD-10-CM | POA: Diagnosis not present

## 2017-08-30 DIAGNOSIS — M0600 Rheumatoid arthritis without rheumatoid factor, unspecified site: Secondary | ICD-10-CM | POA: Diagnosis not present

## 2017-09-10 IMAGING — MG MM DIGITAL SCREENING BILAT W/ TOMO W/ CAD
9 of 12 series · 9 of 28 positions shown · non-contrast
Comparison: Previous exam(s).

CLINICAL DATA: Screening.

EXAM:
2D DIGITAL SCREENING BILATERAL MAMMOGRAM WITH CAD AND ADJUNCT TOMO

[R MLO]
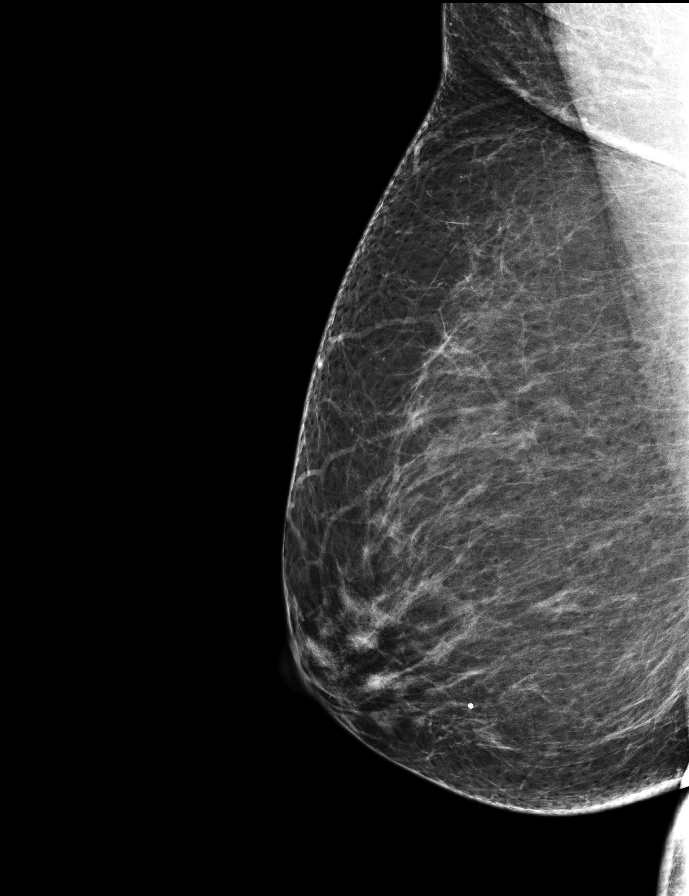

[R CC synth-2D]
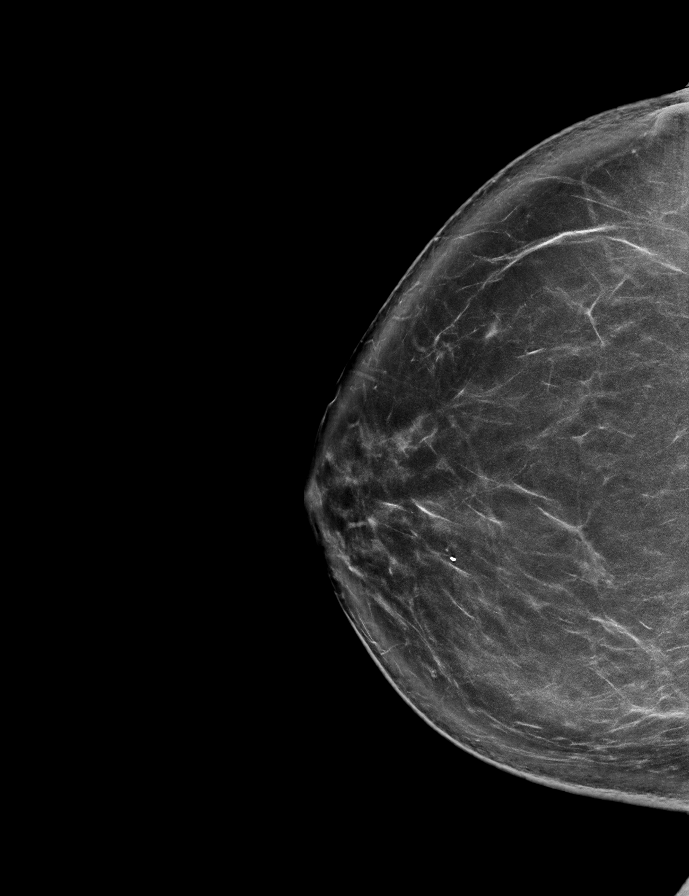

[L CC synth-2D]
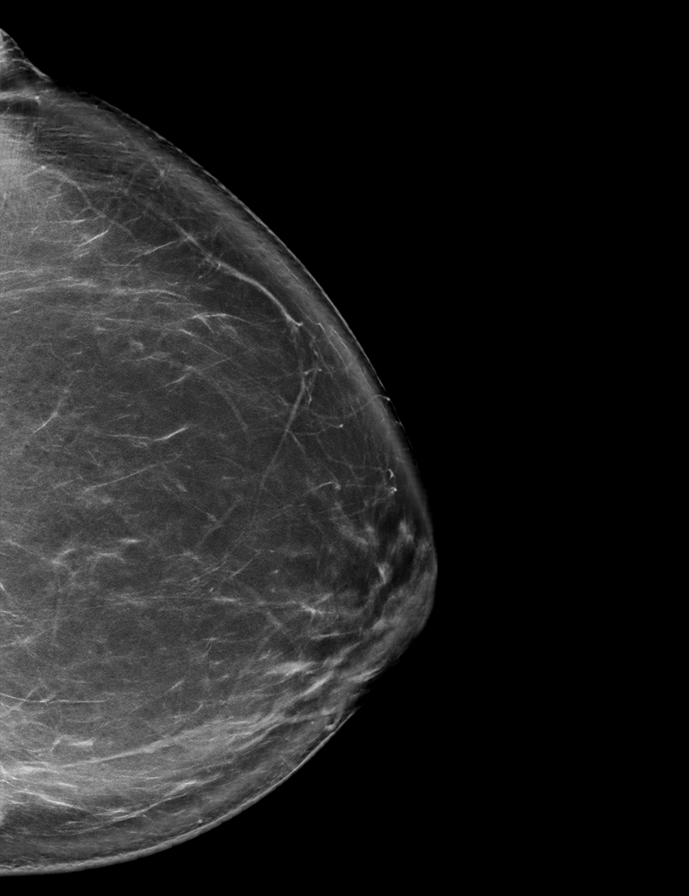

[L MLO]
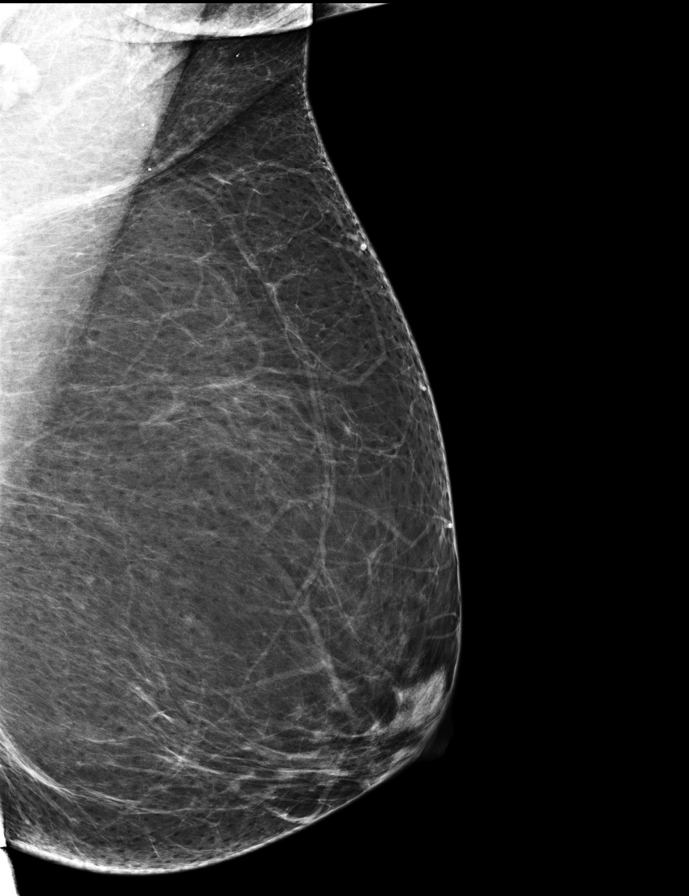

[R CC]
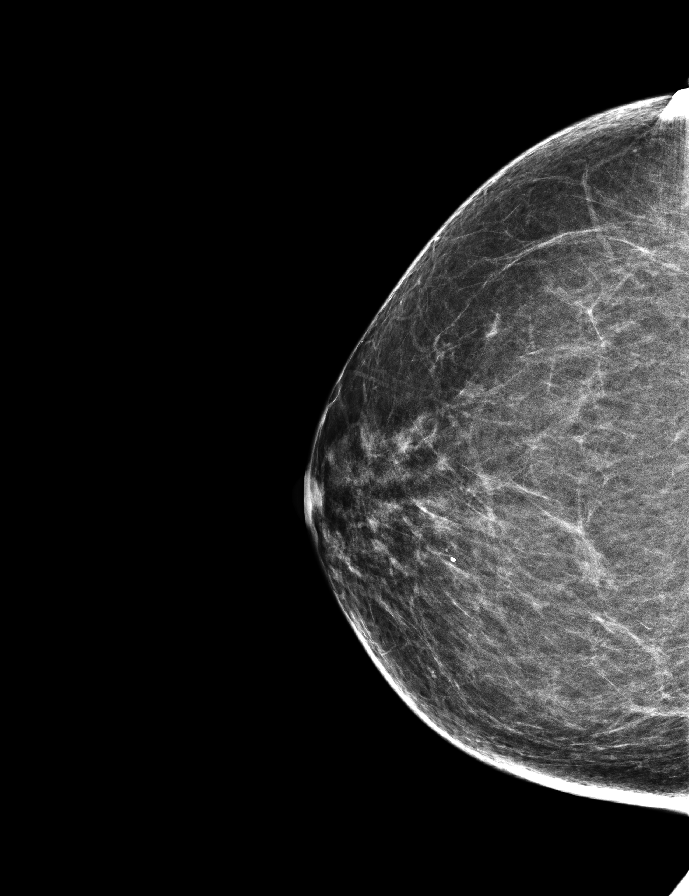

[L CC]
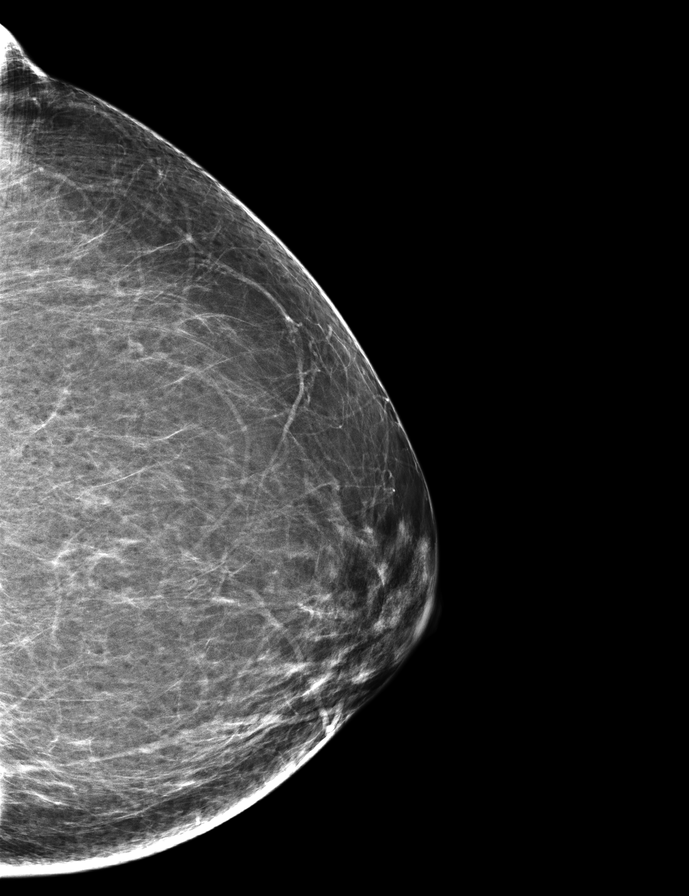

[R MLO synth-2D]
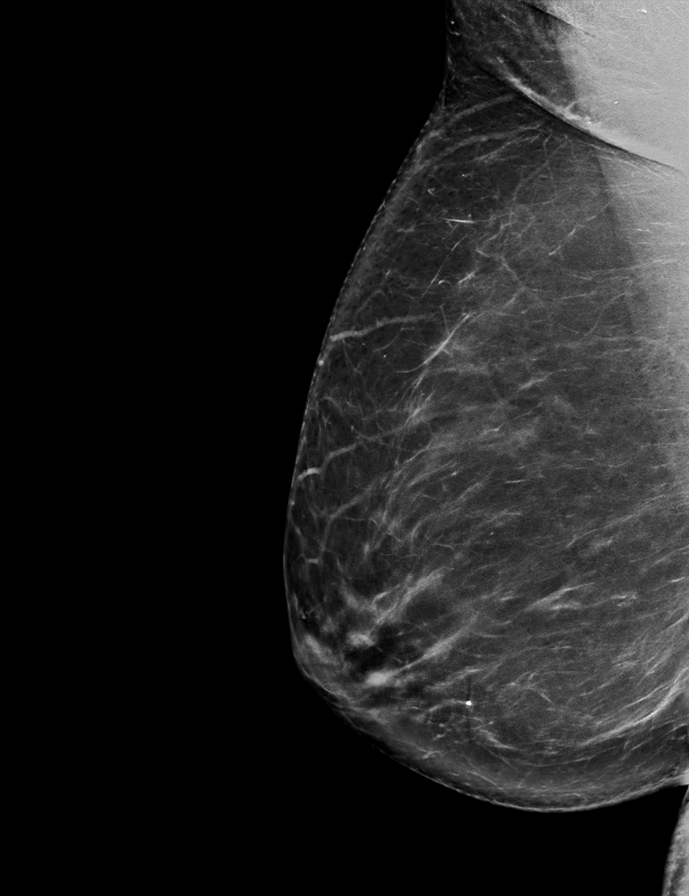

[L MLO synth-2D]
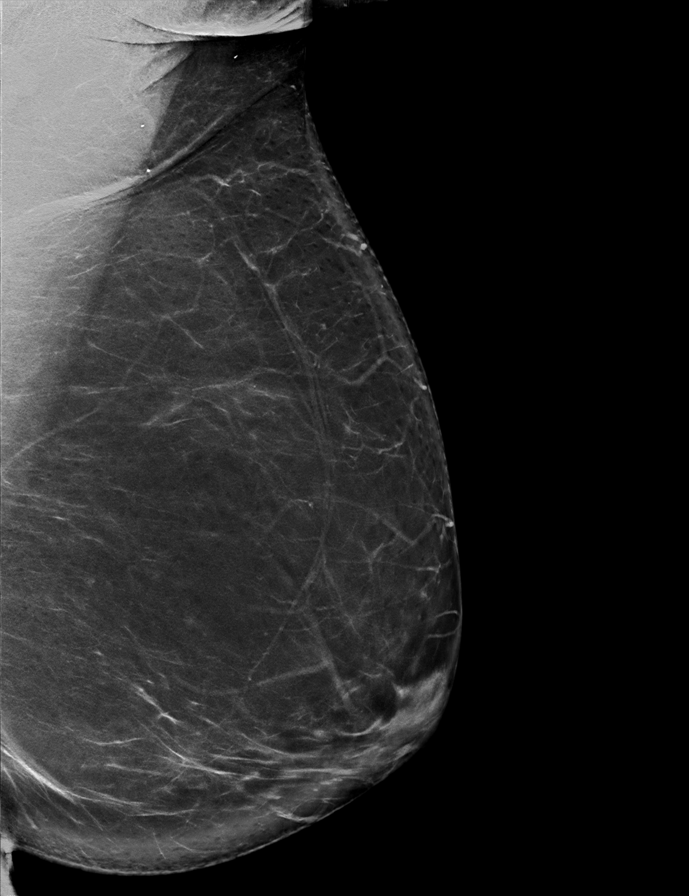

[R CC tomo · tomo slice 45/89.0]
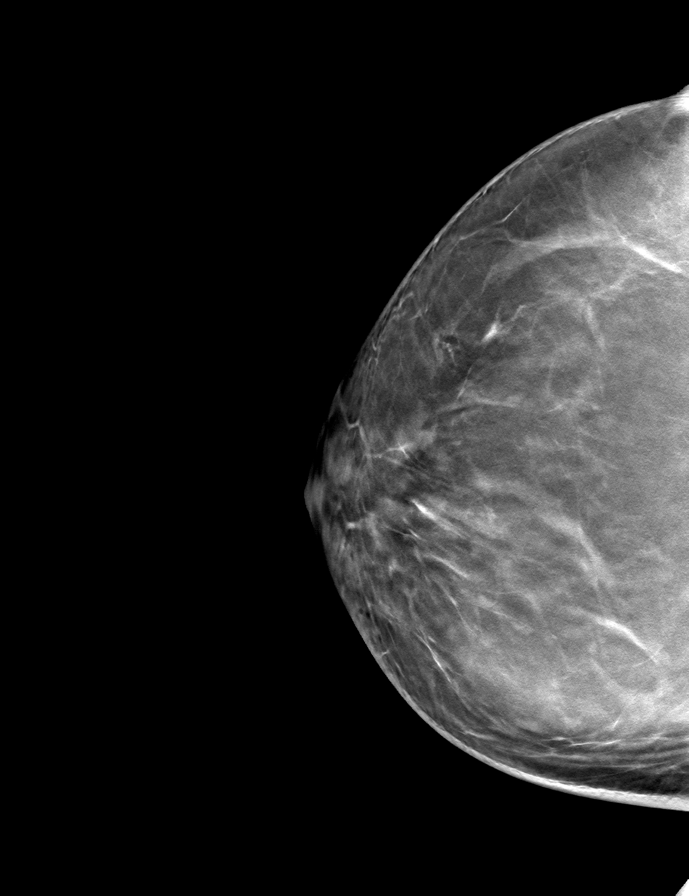

[9 of 28 positions shown; findings below may reference images not displayed]

ACR Breast Density Category b: There are scattered areas of
fibroglandular density.
FINDINGS: There are no findings suspicious for malignancy. Images were
processed with CAD.
IMPRESSION: No mammographic evidence of malignancy. A result letter of this
screening mammogram will be mailed directly to the patient.

RECOMMENDATION:
Screening mammogram in one year. (Code:97-6-RS4)

BI-RADS CATEGORY  1: Negative.

## 2017-10-11 ENCOUNTER — Encounter: Payer: Self-pay | Admitting: Family Medicine

## 2017-10-11 ENCOUNTER — Ambulatory Visit (INDEPENDENT_AMBULATORY_CARE_PROVIDER_SITE_OTHER): Payer: BLUE CROSS/BLUE SHIELD | Admitting: Family Medicine

## 2017-10-11 VITALS — BP 112/70 | HR 81 | Temp 98.5°F | Resp 16 | Ht 60.0 in | Wt 149.4 lb

## 2017-10-11 DIAGNOSIS — E039 Hypothyroidism, unspecified: Secondary | ICD-10-CM | POA: Diagnosis not present

## 2017-10-11 DIAGNOSIS — E119 Type 2 diabetes mellitus without complications: Secondary | ICD-10-CM

## 2017-10-11 DIAGNOSIS — J3089 Other allergic rhinitis: Secondary | ICD-10-CM

## 2017-10-11 DIAGNOSIS — Z23 Encounter for immunization: Secondary | ICD-10-CM | POA: Diagnosis not present

## 2017-10-11 DIAGNOSIS — R51 Headache: Secondary | ICD-10-CM

## 2017-10-11 DIAGNOSIS — K2270 Barrett's esophagus without dysplasia: Secondary | ICD-10-CM

## 2017-10-11 DIAGNOSIS — E785 Hyperlipidemia, unspecified: Secondary | ICD-10-CM

## 2017-10-11 DIAGNOSIS — R519 Headache, unspecified: Secondary | ICD-10-CM

## 2017-10-11 LAB — POCT GLYCOSYLATED HEMOGLOBIN (HGB A1C): HBA1C, POC (CONTROLLED DIABETIC RANGE): 5.8 % (ref 0.0–7.0)

## 2017-10-11 MED ORDER — LEVOTHYROXINE SODIUM 25 MCG PO TABS: 25.0000 ug | ORAL_TABLET | Freq: Every day | ORAL | 0 refills | Status: DC

## 2017-10-11 MED ORDER — AZELASTINE HCL 0.1 % NA SOLN: 2.0000 | Freq: Every day | NASAL | 2 refills | Status: DC

## 2017-10-11 MED ORDER — BUTALBITAL-APAP-CAFFEINE 50-325-40 MG PO TABS: 1.0000 | ORAL_TABLET | Freq: Four times a day (QID) | ORAL | 0 refills | Status: DC | PRN

## 2017-10-11 MED ORDER — ROSUVASTATIN CALCIUM 10 MG PO TABS: 10.0000 mg | ORAL_TABLET | Freq: Every day | ORAL | 1 refills | Status: DC

## 2017-10-11 MED ORDER — LORATADINE 10 MG PO TABS: 10.0000 mg | ORAL_TABLET | Freq: Every day | ORAL | 1 refills | Status: DC

## 2017-10-11 MED ORDER — MONTELUKAST SODIUM 10 MG PO TABS: 10.0000 mg | ORAL_TABLET | Freq: Every day | ORAL | 1 refills | Status: DC

## 2017-10-11 MED ORDER — PANTOPRAZOLE SODIUM 40 MG PO TBEC: 40.0000 mg | DELAYED_RELEASE_TABLET | Freq: Every day | ORAL | 1 refills | Status: DC

## 2017-10-11 NOTE — Progress Notes (Signed)
Name: Colleen Curry   MRN: [id number]    DOB: 05-19-1955   Date:10/11/2017       Progress Note  Subjective  Chief Complaint  Chief Complaint  Patient presents with  . Follow-up    3 mth f/u  . Allergic Rhinitis   . Dyslipidemia  . Hypothyroidism  . Diabetes  . Barretts Esophagus  . Headache  . Medication Refill    90 day supply  . Sore Throat    patient stated that the roof of her mouth very irritated. some itching and soreness.  . Immunizations    HPI  DM II: she had a long history of metabolic syndrome, August 2018 that showedfasting glucose was 123 and hgbA1C 7.2% , she was havingepisodes of moodiness, polyphagia, polydipsia and polyuria, that started June 2018 after placed on prednisone by Dr. Sabra Heck ( ortho)She has family history of DM ( 3 aunts, 2 uncles, brother, sister, daughter and maternal grandmother).She is on diet only, seen by dietician and hgbA1C went  6.4% in 3 months followed by  5.9% , 6.1% and today is 5.8% and is doing great. She stopped eating pasta and breads, avoiding  sweets. Normal foot exam today   Obesity: she changed her diet, seeing dietician at work, also exercising, discussed GLP-1 agonist, she denies family history of thyroid cancer.She denies personal history of pancreatitis.She is not interested on medications at this time. Weight is stable, with slight drop today   Alopecia: she has a long history of hair loss, seen by Dermatologist and is now on topical steroids. Wears a wig and is feeling well  AR: she has symptoms year round, worse at work with fiber and dust, she has been out of singulair and loratadine, noticed sneezing last week, and is very congested, no vertigo, but sometimes feels like a little lightheaded. We will resume medications   RA: seeing Dr. Meda Coffee, diagnosed in Feb, but started on Methotrexate March 2018, she seems to be tolerating medication, she has hand stiffness in the mornings, but swelling hasresolved, fatigue is  not as significant and is intermittent. However noticing more ankle pain at the end of the day. No knee problems.   Headaches: she states she has recurrent headaches, started in her 75's, described as gradual onset, throbbing like, it can be temporal, frontal or nuchal area, when intense she has nausea and sound sensitivity, denies aura, brother has a history of migraine. She states it can last more than one day, no photophobia.Responding to Fioricet prn, she has episodes at most once a week. Unchanged   Hypothyroidism: taking 25 mcg daily alternating with 50 mcg every other day, last TSH was back to normal  Unchanged  Barretts Esophagus: controlled with pantoprazole, she denies heartburn or dysphagia. She needs refill  Dyslipidemia: LDL was 166 and went down to 91 after she started  Crestor 08/2016. Continue 81 mg aspirin daily  Patient Active Problem List   Diagnosis Date Noted  . Prediabetes 06/05/2017  . Seronegative rheumatoid arthritis ([place]) 06/30/2016  . Cyclic citrullinated peptide (CCP) antibody positive 03/14/2016  . Chondromalacia of both patellae 03/14/2016  . Acquired trigger finger 03/14/2016  . History of hysterectomy 08/08/2014  . Menopause 08/08/2014  . History of iron deficiency anemia 08/08/2014  . Perennial allergic rhinitis 08/07/2014  . Barrett esophagus 08/07/2014  . Dyslipidemia 08/07/2014  . Family history of diabetes mellitus 08/07/2014  . Esophagitis, reflux 08/07/2014  . Acquired hypothyroidism 08/07/2014  . Dysmetabolic syndrome 27/74/1287  . Obesity (BMI 30-39.9)  08/07/2014  . Vitamin D deficiency 08/07/2014  . Tubular adenoma of colon 08/07/2014    Past Surgical History:  Procedure Laterality Date  . ABDOMINAL HYSTERECTOMY      Family History  Problem Relation Age of Onset  . Glaucoma Brother   . Stroke Paternal Grandfather   . Thyroid disease Father   . Heart disease Father   . Seizures Father   . Diabetes Sister   . Hypertension  Sister   . Breast cancer Neg Hx     Social History   Socioeconomic History  . Marital status: Divorced    Spouse name: Not on file  . Number of children: 2  . Years of education: Not on file  . Highest education level: Associate degree: occupational, Hotel manager, or vocational program  Occupational History  . Not on file  Social Needs  . Financial resource strain: Not hard at all  . Food insecurity:    Worry: Never true    Inability: Never true  . Transportation needs:    Medical: No    Non-medical: No  Tobacco Use  . Smoking status: Never Smoker  . Smokeless tobacco: Never Used  Substance and Sexual Activity  . Alcohol use: No    Alcohol/week: 0.0 standard drinks  . Drug use: No  . Sexual activity: Yes    Partners: Male  Lifestyle  . Physical activity:    Days per week: 0 days    Minutes per session: 0 min  . Stress: Not at all  Relationships  . Social connections:    Talks on phone: More than three times a week    Gets together: More than three times a week    Attends religious service: More than 4 times per year    Active member of club or organization: Yes    Attends meetings of clubs or organizations: More than 4 times per year    Relationship status: Divorced  . Intimate partner violence:    Fear of current or ex partner: No    Emotionally abused: No    Physically abused: No    Forced sexual activity: No  Other Topics Concern  . Not on file  Social History Narrative  . Not on file     Current Outpatient Medications:  .  aspirin (ASPIRIN ADULT LOW STRENGTH) 81 MG chewable tablet, Chew by mouth., Disp: , Rfl:  .  azelastine (ASTELIN) 0.1 % nasal spray, Place 2 sprays into both nostrils daily. Use in each nostril as directed, Disp: 30 mL, Rfl: 2 .  Biotin 1000 MCG tablet, Take 1 tablet by mouth daily., Disp: , Rfl:  .  butalbital-acetaminophen-caffeine (FIORICET, ESGIC) 50-325-40 MG tablet, Take 1-2 tablets by mouth every 6 (six) hours as needed for  headache., Disp: 30 tablet, Rfl: 0 .  Clobetasol Propionate (TEMOVATE) 0.05 % external spray, APPLY EVERY DAY ON SCALP, Disp: 59 mL, Rfl: 0 .  fluticasone (FLONASE) 50 MCG/ACT nasal spray, Place 2 sprays into both nostrils daily., Disp: 48 g, Rfl: 1 .  levothyroxine (SYNTHROID, LEVOTHROID) 25 MCG tablet, Take 1-2 tablets (25-50 mcg total) by mouth daily before breakfast. Alternate one pill and two pills every other day, Disp: 135 tablet, Rfl: 0 .  loratadine (CLARITIN) 10 MG tablet, Take 1 tablet (10 mg total) by mouth daily., Disp: 90 tablet, Rfl: 1 .  methotrexate (RHEUMATREX) 2.5 MG tablet, Take 2.5 mg by mouth once a week., Disp: , Rfl:  .  montelukast (SINGULAIR) 10 MG tablet, Take 1  tablet (10 mg total) by mouth at bedtime., Disp: 90 tablet, Rfl: 1 .  pantoprazole (PROTONIX) 40 MG tablet, Take 1 tablet (40 mg total) by mouth daily., Disp: 90 tablet, Rfl: 1 .  rosuvastatin (CRESTOR) 10 MG tablet, Take 1 tablet (10 mg total) by mouth daily., Disp: 90 tablet, Rfl: 1  Allergies  Allergen Reactions  . Prednisone Other (See Comments)    Elevated blood pressure, glucose spiked, mood swings  . Codeine Sulfate Other (See Comments)  . Latex Other (See Comments)    I personally reviewed active problem list, medication list, allergies, family history, social history with the patient/caregiver today.   ROS  Constitutional: Negative for fever or weight change.  Respiratory: Negative for cough and shortness of breath.   Cardiovascular: Negative for chest pain or palpitations.  Gastrointestinal: Negative for abdominal pain, no bowel changes.  Musculoskeletal: Negative for gait problem or joint swelling.  Skin: Negative for rash.  Neurological: Negative for dizziness, positive for intermittent  headache.  No other specific complaints in a complete review of systems (except as listed in HPI above).  Objective  Vitals:   10/11/17 1105  BP: 112/70  Pulse: 81  Resp: 16  Temp: 98.5 F (36.9  C)  TempSrc: Oral  SpO2: 99%  Weight: 149 lb 6.4 oz (67.8 kg)  Height: 5' (1.524 m)    Body mass index is 29.18 kg/m.  Physical Exam  Constitutional: Patient appears well-developed and well-nourished. Obese  No distress.  HEENT: head atraumatic, normocephalic, pupils equal and reactive to light, ears wax on both ear canals, neck supple, throat within normal limits, bulging turbinates bilaterally.  Cardiovascular: Normal rate, regular rhythm and normal heart sounds.  No murmur heard. No BLE edema. Pulmonary/Chest: Effort normal and breath sounds normal. No respiratory distress. Abdominal: Soft.  There is no tenderness. Psychiatric: Patient has a normal mood and affect. behavior is normal. Judgment and thought content normal.  Diabetic Foot Exam: Diabetic Foot Exam - Simple   Simple Foot Form Diabetic Foot exam was performed with the following findings:  Yes 10/11/2017 11:50 AM  Visual Inspection No deformities, no ulcerations, no other skin breakdown bilaterally:  Yes Sensation Testing Intact to touch and monofilament testing bilaterally:  Yes Pulse Check Posterior Tibialis and Dorsalis pulse intact bilaterally:  Yes Comments      PHQ2/9: Depression screen [facility] 2/9 10/11/2017 07/14/2017 03/15/2017 09/08/2016 05/18/2016  Decreased Interest 0 0 0 0 0  Down, Depressed, Hopeless 0 0 1 0 0  PHQ - 2 Score 0 0 1 0 0  Altered sleeping 0 0 - - -  Tired, decreased energy 0 1 - - -  Change in appetite 0 0 - - -  Feeling bad or failure about yourself  0 0 - - -  Trouble concentrating 0 0 - - -  Moving slowly or fidgety/restless 0 0 - - -  Suicidal thoughts 0 - - - -  PHQ-9 Score 0 1 - - -  Difficult doing work/chores Not difficult at all Not difficult at all - - -     Fall Risk: Fall Risk  10/11/2017 07/14/2017 03/15/2017 12/07/2016 09/08/2016  Falls in the past year? No No No No No     Functional Status Survey: Is the patient deaf or have difficulty hearing?: No Does the patient  have difficulty seeing, even when wearing glasses/contacts?: No Does the patient have difficulty concentrating, remembering, or making decisions?: No Does the patient have difficulty walking or climbing stairs?: No Does  the patient have difficulty dressing or bathing?: No Does the patient have difficulty doing errands alone such as visiting a doctor's office or shopping?: No    Assessment & Plan  1. Type 2 diabetes, diet controlled (HCC)  - POCT glycosylated hemoglobin (Hb A1C)  2. Need for influenza vaccination  - Flu Vaccine QUAD 6+ mos PF IM (Fluarix Quad PF)  3. Headache, unspecified headache type  - butalbital-acetaminophen-caffeine (FIORICET, ESGIC) 50-325-40 MG tablet; Take 1-2 tablets by mouth every 6 (six) hours as needed for headache.  Dispense: 30 tablet; Refill: 0  4. Acquired hypothyroidism  - levothyroxine (SYNTHROID, LEVOTHROID) 25 MCG tablet; Take 1-2 tablets (25-50 mcg total) by mouth daily before breakfast. Alternate one pill and two pills every other day  Dispense: 135 tablet; Refill: 0  5. Barrett's esophagus without dysplasia  - pantoprazole (PROTONIX) 40 MG tablet; Take 1 tablet (40 mg total) by mouth daily.  Dispense: 90 tablet; Refill: 1  6. Perennial allergic rhinitis  - montelukast (SINGULAIR) 10 MG tablet; Take 1 tablet (10 mg total) by mouth at bedtime.  Dispense: 90 tablet; Refill: 1  7. Dyslipidemia  - rosuvastatin (CRESTOR) 10 MG tablet; Take 1 tablet (10 mg total) by mouth daily.  Dispense: 90 tablet; Refill: 1  8. Need for shingles vaccine  - Varicella-zoster vaccine IM

## 2017-10-12 DIAGNOSIS — L648 Other androgenic alopecia: Secondary | ICD-10-CM | POA: Diagnosis not present

## 2017-12-11 ENCOUNTER — Ambulatory Visit: Payer: BLUE CROSS/BLUE SHIELD

## 2017-12-12 DIAGNOSIS — L439 Lichen planus, unspecified: Secondary | ICD-10-CM | POA: Diagnosis not present

## 2017-12-12 DIAGNOSIS — L648 Other androgenic alopecia: Secondary | ICD-10-CM | POA: Diagnosis not present

## 2017-12-20 ENCOUNTER — Ambulatory Visit: Payer: BLUE CROSS/BLUE SHIELD

## 2018-01-03 ENCOUNTER — Other Ambulatory Visit: Payer: Self-pay | Admitting: Family Medicine

## 2018-01-03 DIAGNOSIS — E039 Hypothyroidism, unspecified: Secondary | ICD-10-CM

## 2018-01-25 ENCOUNTER — Other Ambulatory Visit: Payer: Self-pay | Admitting: Family Medicine

## 2018-01-25 DIAGNOSIS — J3089 Other allergic rhinitis: Secondary | ICD-10-CM

## 2018-02-05 DIAGNOSIS — L439 Lichen planus, unspecified: Secondary | ICD-10-CM | POA: Diagnosis not present

## 2018-02-05 DIAGNOSIS — L648 Other androgenic alopecia: Secondary | ICD-10-CM | POA: Diagnosis not present

## 2018-02-09 ENCOUNTER — Encounter: Payer: Self-pay | Admitting: Family Medicine

## 2018-02-09 ENCOUNTER — Ambulatory Visit (INDEPENDENT_AMBULATORY_CARE_PROVIDER_SITE_OTHER): Payer: BLUE CROSS/BLUE SHIELD | Admitting: Family Medicine

## 2018-02-09 VITALS — BP 114/72 | HR 84 | Temp 97.8°F | Resp 16 | Ht 60.0 in | Wt 155.6 lb

## 2018-02-09 DIAGNOSIS — J3089 Other allergic rhinitis: Secondary | ICD-10-CM

## 2018-02-09 DIAGNOSIS — E039 Hypothyroidism, unspecified: Secondary | ICD-10-CM

## 2018-02-09 DIAGNOSIS — R51 Headache: Secondary | ICD-10-CM

## 2018-02-09 DIAGNOSIS — E119 Type 2 diabetes mellitus without complications: Secondary | ICD-10-CM

## 2018-02-09 DIAGNOSIS — K2270 Barrett's esophagus without dysplasia: Secondary | ICD-10-CM

## 2018-02-09 DIAGNOSIS — E785 Hyperlipidemia, unspecified: Secondary | ICD-10-CM

## 2018-02-09 DIAGNOSIS — Z114 Encounter for screening for human immunodeficiency virus [HIV]: Secondary | ICD-10-CM

## 2018-02-09 DIAGNOSIS — R519 Headache, unspecified: Secondary | ICD-10-CM

## 2018-02-09 DIAGNOSIS — R809 Proteinuria, unspecified: Secondary | ICD-10-CM

## 2018-02-09 DIAGNOSIS — E1129 Type 2 diabetes mellitus with other diabetic kidney complication: Secondary | ICD-10-CM | POA: Insufficient documentation

## 2018-02-09 LAB — POCT UA - MICROALBUMIN: MICROALBUMIN (UR) POC: 50 mg/L

## 2018-02-09 LAB — POCT GLYCOSYLATED HEMOGLOBIN (HGB A1C): HBA1C, POC (CONTROLLED DIABETIC RANGE): 6.3 % (ref 0.0–7.0)

## 2018-02-09 LAB — TSH: TSH: 2.65 (ref 0.41–5.90)

## 2018-02-09 LAB — LIPID PANEL
Cholesterol: 169 (ref 0–200)
HDL: 74 — AB (ref 35–70)
LDL Cholesterol: 83
Triglycerides: 61 (ref 40–160)

## 2018-02-09 MED ORDER — MONTELUKAST SODIUM 10 MG PO TABS: 10.0000 mg | ORAL_TABLET | Freq: Every day | ORAL | 1 refills | Status: DC

## 2018-02-09 MED ORDER — PANTOPRAZOLE SODIUM 40 MG PO TBEC: 40.0000 mg | DELAYED_RELEASE_TABLET | Freq: Every day | ORAL | 1 refills | Status: DC

## 2018-02-09 MED ORDER — ROSUVASTATIN CALCIUM 10 MG PO TABS: 10.0000 mg | ORAL_TABLET | Freq: Every day | ORAL | 1 refills | Status: DC

## 2018-02-09 MED ORDER — FLUTICASONE PROPIONATE 50 MCG/ACT NA SUSP: 2.0000 | Freq: Every day | NASAL | 1 refills | Status: DC

## 2018-02-09 MED ORDER — BUTALBITAL-APAP-CAFFEINE 50-325-40 MG PO TABS: 1.0000 | ORAL_TABLET | Freq: Four times a day (QID) | ORAL | 0 refills | Status: DC | PRN

## 2018-02-09 MED ORDER — LORATADINE 10 MG PO TABS: 10.0000 mg | ORAL_TABLET | Freq: Every day | ORAL | 1 refills | Status: DC

## 2018-02-09 NOTE — Progress Notes (Signed)
Name: Colleen Curry   MRN: [id number]    DOB: 1956-01-03   Date:02/09/2018       Progress Note  Subjective  Chief Complaint  Chief Complaint  Patient presents with  . Medication Refill  . Diabetes  . Alopecia  . Hypothyroidism  . Dyslipidemia  . Barretts Esophagus  . Allergic Rhinitis     Constantly wiping her eyes and has darken areas under her eyes. Would like a referral to ENT  . Obesity    HPI  DM II: she had a long history of metabolic syndrome, [id number] showedfasting glucose was 123 and hgbA1C 7.2% , she was havingepisodes of moodiness, polyphagia, polydipsia and polyuria, that started June 2018 after placed on prednisone by Dr. Sabra Heck ( ortho)She has family history of DM ( 3 aunts, 2 uncles, brother, sister, daughter and maternal grandmother).She is on diet only, seen by dietician and hgbA1Cwent6.4% in 3 months followed by  5.9% , 6.1% ,  5.8% up a little today at 6.1% and has protein in the urine, but still  doing great. She will stop drinking sweet tea again, bp is towards low end of normal, therefore we will recheck urine micro before placing her on medication   Obesity: she changed her diet, seeing dietician at work, also exercising, discussed GLP-1 agonist, she denies family history of thyroid cancer.She denies personal history of pancreatitis.She is not interested on medications at this time. She gained 10 lbs over the holidays but going down again.   Alopecia: she has a long history of hair loss, seen by Dermatologist and is now on topical steroids and finasteride she states it seems to be helping , still wearing a wig   AR: she has symptoms year round, worse at work with fiber and dust, singulair, nasal spray, also takes loratadine, still has watery eyes, nasal congestion and sneezing.   RA: seeing Dr. Meda Coffee, diagnosed in Feb 2018 , but started on Methotrexate March 2018, she seems to be tolerating medication,she has hand stiffness in the mornings,  butswelling of hands resolved, she still has fatigue but she thinks it is because she has difficulty staying asleep. Recent labs done at Dr. Meda Coffee reviewed with patient today.  Insomnia: she states she wakes up during the night a few times a week and has difficulty falling back asleep, discussed ways to improve sleep, may also take melatonin   Headaches: she states she has recurrent headaches, started in her 30's, described as gradual onset, throbbing like, it can be temporal, frontal or nuchal area, when intense she has nausea and sound sensitivity, denies aura, brother has a history of migraine. She states it can last more than one day, no photophobia.Responding to Fioricet prn, she has episodes at most once a week.   Hypothyroidism: taking 25 mcg dailyalternating with 50 mcg every other day, last TSH was back to normal We will check TSH today, no change in bowel movements, she has chronic dry skin  Barretts Esophagus: controlled with pantoprazole,she denies heartburn or dysphagia. She states she has heartburn when she spicy or citric food and or skips meals  Dyslipidemia: LDL was 166and went down to 91 after she startedCrestor 08/2016. Continue 81 mg aspirin daily, no myalgias or chest pain . Recheck today    Patient Active Problem List   Diagnosis Date Noted  . Prediabetes 06/05/2017  . Seronegative rheumatoid arthritis ([place]) 06/30/2016  . Cyclic citrullinated peptide (CCP) antibody positive 03/14/2016  . Chondromalacia of both patellae 03/14/2016  . Acquired  trigger finger 03/14/2016  . History of hysterectomy 08/08/2014  . Menopause 08/08/2014  . History of iron deficiency anemia 08/08/2014  . Perennial allergic rhinitis 08/07/2014  . Barrett esophagus 08/07/2014  . Dyslipidemia 08/07/2014  . Family history of diabetes mellitus 08/07/2014  . Esophagitis, reflux 08/07/2014  . Acquired hypothyroidism 08/07/2014  . Dysmetabolic syndrome 87/56/4332  . Obesity (BMI  30-39.9) 08/07/2014  . Vitamin D deficiency 08/07/2014  . Tubular adenoma of colon 08/07/2014    Past Surgical History:  Procedure Laterality Date  . ABDOMINAL HYSTERECTOMY      Family History  Problem Relation Age of Onset  . Glaucoma Brother   . Stroke Paternal Grandfather   . Thyroid disease Father   . Heart disease Father   . Seizures Father   . Diabetes Sister   . Hypertension Sister   . Breast cancer Neg Hx     Social History   Socioeconomic History  . Marital status: Divorced    Spouse name: Not on file  . Number of children: 2  . Years of education: Not on file  . Highest education level: Associate degree: occupational, Hotel manager, or vocational program  Occupational History  . Not on file  Social Needs  . Financial resource strain: Not hard at all  . Food insecurity:    Worry: Never true    Inability: Never true  . Transportation needs:    Medical: No    Non-medical: No  Tobacco Use  . Smoking status: Never Smoker  . Smokeless tobacco: Never Used  Substance and Sexual Activity  . Alcohol use: No    Alcohol/week: 0.0 standard drinks  . Drug use: No  . Sexual activity: Yes    Partners: Male  Lifestyle  . Physical activity:    Days per week: 0 days    Minutes per session: 0 min  . Stress: Not at all  Relationships  . Social connections:    Talks on phone: More than three times a week    Gets together: More than three times a week    Attends religious service: More than 4 times per year    Active member of club or organization: Yes    Attends meetings of clubs or organizations: More than 4 times per year    Relationship status: Divorced  . Intimate partner violence:    Fear of current or ex partner: No    Emotionally abused: No    Physically abused: No    Forced sexual activity: No  Other Topics Concern  . Not on file  Social History Narrative  . Not on file     Current Outpatient Medications:  .  aspirin (ASPIRIN ADULT LOW STRENGTH) 81  MG chewable tablet, Chew by mouth., Disp: , Rfl:  .  azelastine (ASTELIN) 0.1 % nasal spray, Place 2 sprays into both nostrils daily. Use in each nostril as directed, Disp: 30 mL, Rfl: 2 .  Biotin 1000 MCG tablet, Take 1 tablet by mouth daily., Disp: , Rfl:  .  butalbital-acetaminophen-caffeine (FIORICET, ESGIC) 50-325-40 MG tablet, Take 1-2 tablets by mouth every 6 (six) hours as needed for headache., Disp: 30 tablet, Rfl: 0 .  Clobetasol Propionate (TEMOVATE) 0.05 % external spray, APPLY EVERY DAY ON SCALP, Disp: 59 mL, Rfl: 0 .  finasteride (PROSCAR) 5 MG tablet, , Disp: , Rfl:  .  fluticasone (FLONASE) 50 MCG/ACT nasal spray, Place 2 sprays into both nostrils daily., Disp: 48 g, Rfl: 1 .  folic acid (FOLVITE) 1  MG tablet, Take 1 mg by mouth daily., Disp: , Rfl: 4 .  levothyroxine (SYNTHROID, LEVOTHROID) 25 MCG tablet, TAKE 1-2 TABLETS BY MOUTH DAILY BEFORE BREAKFAST. ALTERNATE ONE PILL AND TWO PILLS EVERY OTHER DAY, Disp: 135 tablet, Rfl: 0 .  loratadine (CLARITIN) 10 MG tablet, Take 1 tablet (10 mg total) by mouth daily., Disp: 90 tablet, Rfl: 1 .  methotrexate (RHEUMATREX) 2.5 MG tablet, Take 2.5 mg by mouth once a week., Disp: , Rfl:  .  montelukast (SINGULAIR) 10 MG tablet, Take 1 tablet (10 mg total) by mouth at bedtime., Disp: 90 tablet, Rfl: 1 .  pantoprazole (PROTONIX) 40 MG tablet, Take 1 tablet (40 mg total) by mouth daily., Disp: 90 tablet, Rfl: 1 .  rosuvastatin (CRESTOR) 10 MG tablet, Take 1 tablet (10 mg total) by mouth daily., Disp: 90 tablet, Rfl: 1  Allergies  Allergen Reactions  . Prednisone Other (See Comments)    Elevated blood pressure, glucose spiked, mood swings  . Codeine Sulfate Other (See Comments)  . Latex Other (See Comments)    I personally reviewed active problem list, medication list, allergies, family history, social history with the patient/caregiver today.   ROS  Constitutional: Negative for fever, positive for  weight change.  Respiratory: Negative  for cough and shortness of breath.   Cardiovascular: Negative for chest pain or palpitations.  Gastrointestinal: Negative for abdominal pain, no bowel changes.  Musculoskeletal: Negative for gait problem, positive for intermittent joint swelling.  Skin: Negative for rash.  Neurological: Negative for dizziness or headache.  No other specific complaints in a complete review of systems (except as listed in HPI above).  Objective  Vitals:   02/09/18 0858  BP: 114/72  Pulse: 84  Resp: 16  Temp: 97.8 F (36.6 C)  TempSrc: Oral  SpO2: 98%  Weight: 155 lb 9.6 oz (70.6 kg)  Height: 5' (1.524 m)    Body mass index is 30.39 kg/m.  Physical Exam  Constitutional: Patient appears well-developed and well-nourished. Obese No distress.  HEENT: head atraumatic, normocephalic, pupils equal and reactive to light, , neck supple, throat within normal limits Cardiovascular: Normal rate, regular rhythm and normal heart sounds.  No murmur heard. No BLE edema. Pulmonary/Chest: Effort normal and breath sounds normal. No respiratory distress. Abdominal: Soft.  There is no tenderness. Psychiatric: Patient has a normal mood and affect. behavior is normal. Judgment and thought content normal.  Recent Results (from the past 2160 hour(s))  POCT HgB A1C     Status: Normal   Collection Time: 02/09/18  9:03 AM  Result Value Ref Range   Hemoglobin A1C     HbA1c POC (<> result, manual entry)     HbA1c, POC (prediabetic range)     HbA1c, POC (controlled diabetic range) 6.3 0.0 - 7.0 %  POCT UA - Microalbumin     Status: Abnormal   Collection Time: 02/09/18  9:03 AM  Result Value Ref Range   Microalbumin Ur, POC 50 mg/L   Creatinine, POC     Albumin/Creatinine Ratio, Urine, POC        PHQ2/9: Depression screen [facility] 2/9 02/09/2018 10/11/2017 07/14/2017 03/15/2017 09/08/2016  Decreased Interest 0 0 0 0 0  Down, Depressed, Hopeless 0 0 0 1 0  PHQ - 2 Score 0 0 0 1 0  Altered sleeping - 0 0 - -  Tired,  decreased energy - 0 1 - -  Change in appetite - 0 0 - -  Feeling bad or failure about yourself  -  0 0 - -  Trouble concentrating - 0 0 - -  Moving slowly or fidgety/restless - 0 0 - -  Suicidal thoughts - 0 - - -  PHQ-9 Score - 0 1 - -  Difficult doing work/chores - Not difficult at all Not difficult at all - -     Fall Risk: Fall Risk  02/09/2018 10/11/2017 07/14/2017 03/15/2017 12/07/2016  Falls in the past year? 0 No No No No    Functional Status Survey: Is the patient deaf or have difficulty hearing?: No Does the patient have difficulty seeing, even when wearing glasses/contacts?: No Does the patient have difficulty concentrating, remembering, or making decisions?: No Does the patient have difficulty walking or climbing stairs?: No Does the patient have difficulty dressing or bathing?: No Does the patient have difficulty doing errands alone such as visiting a doctor's office or shopping?: No   Assessment & Plan  1. Type 2 diabetes, diet controlled ([place])  - POCT HgB A1C - POCT UA - Microalbumin  2. Headache, unspecified headache type  - butalbital-acetaminophen-caffeine (FIORICET, ESGIC) 50-325-40 MG tablet; Take 1-2 tablets by mouth every 6 (six) hours as needed for headache.  Dispense: 30 tablet; Refill: 0  3. Perennial allergic rhinitis  - fluticasone (FLONASE) 50 MCG/ACT nasal spray; Place 2 sprays into both nostrils daily.  Dispense: 48 g; Refill: 1 - loratadine (CLARITIN) 10 MG tablet; Take 1 tablet (10 mg total) by mouth daily.  Dispense: 90 tablet; Refill: 1 - montelukast (SINGULAIR) 10 MG tablet; Take 1 tablet (10 mg total) by mouth at bedtime.  Dispense: 90 tablet; Refill: 1  4. Barrett's esophagus without dysplasia  - pantoprazole (PROTONIX) 40 MG tablet; Take 1 tablet (40 mg total) by mouth daily.  Dispense: 90 tablet; Refill: 1  5. Dyslipidemia  - rosuvastatin (CRESTOR) 10 MG tablet; Take 1 tablet (10 mg total) by mouth daily.  Dispense: 90 tablet; Refill:  1 - Lipid panel  6. Acquired hypothyroidism  - TSH  7. Encounter for screening for HIV  - HIV Antibody (routine testing w rflx)  8. Controlled type 2 diabetes mellitus with microalbuminuria, without long-term current use of insulin (HCC)  We will hold off on medication until we recheck level in 6 -8 months.

## 2018-02-10 LAB — HM HIV SCREENING LAB: HM HIV SCREENING: NEGATIVE

## 2018-03-01 DIAGNOSIS — Z79899 Other long term (current) drug therapy: Secondary | ICD-10-CM | POA: Diagnosis not present

## 2018-03-01 DIAGNOSIS — M0600 Rheumatoid arthritis without rheumatoid factor, unspecified site: Secondary | ICD-10-CM | POA: Diagnosis not present

## 2018-03-01 DIAGNOSIS — R768 Other specified abnormal immunological findings in serum: Secondary | ICD-10-CM | POA: Diagnosis not present

## 2018-03-05 DIAGNOSIS — L439 Lichen planus, unspecified: Secondary | ICD-10-CM | POA: Diagnosis not present

## 2018-03-05 DIAGNOSIS — L818 Other specified disorders of pigmentation: Secondary | ICD-10-CM | POA: Diagnosis not present

## 2018-04-04 ENCOUNTER — Other Ambulatory Visit: Payer: Self-pay | Admitting: Family Medicine

## 2018-04-04 DIAGNOSIS — E039 Hypothyroidism, unspecified: Secondary | ICD-10-CM

## 2018-04-04 NOTE — Telephone Encounter (Signed)
Refill request for thyroid medication: Levothyroxine 25 mcg  Last Physical: 05/18/2016  Lab Results  Component Value Date   TSH 2.65 02/09/2018    Follow-ups on file. 06/11/2018

## 2018-06-11 ENCOUNTER — Encounter: Payer: Self-pay | Admitting: Family Medicine

## 2018-06-11 ENCOUNTER — Ambulatory Visit (INDEPENDENT_AMBULATORY_CARE_PROVIDER_SITE_OTHER): Payer: BLUE CROSS/BLUE SHIELD | Admitting: Family Medicine

## 2018-06-11 VITALS — Ht 60.0 in | Wt 160.0 lb

## 2018-06-11 DIAGNOSIS — E039 Hypothyroidism, unspecified: Secondary | ICD-10-CM | POA: Diagnosis not present

## 2018-06-11 DIAGNOSIS — M0600 Rheumatoid arthritis without rheumatoid factor, unspecified site: Secondary | ICD-10-CM | POA: Diagnosis not present

## 2018-06-11 DIAGNOSIS — E669 Obesity, unspecified: Secondary | ICD-10-CM

## 2018-06-11 DIAGNOSIS — E1129 Type 2 diabetes mellitus with other diabetic kidney complication: Secondary | ICD-10-CM

## 2018-06-11 DIAGNOSIS — L659 Nonscarring hair loss, unspecified: Secondary | ICD-10-CM

## 2018-06-11 DIAGNOSIS — E785 Hyperlipidemia, unspecified: Secondary | ICD-10-CM

## 2018-06-11 DIAGNOSIS — K2270 Barrett's esophagus without dysplasia: Secondary | ICD-10-CM | POA: Diagnosis not present

## 2018-06-11 DIAGNOSIS — E1169 Type 2 diabetes mellitus with other specified complication: Secondary | ICD-10-CM

## 2018-06-11 DIAGNOSIS — R809 Proteinuria, unspecified: Secondary | ICD-10-CM

## 2018-06-11 NOTE — Progress Notes (Signed)
Name: Colleen Curry   MRN: [id number]    DOB: October 30, 1955   Date:06/11/2018       Progress Note  Subjective  Chief Complaint  Chief Complaint  Patient presents with  . Medication Refill  . Diabetes  . Obesity  . Allergic Rhinitis   . Insomnia  . Rheumatoid Arthritis  . Headache  . Hypothyroidism  . Dyslipidemia  . Alopecia    I connected with  Lind Covert  on 06/11/18 at 10:00 AM EDT by a video enabled telemedicine application and verified that I am speaking with the correct person using two identifiers.  I discussed the limitations of evaluation and management by telemedicine and the availability of in person appointments. The patient expressed understanding and agreed to proceed. Staff also discussed with the patient that there may be a patient responsible charge related to this service. Patient Location: [facility] Provider Location: at home  Additional Individuals present: grand-baby   HPI  DM II: she had a long history of metabolic syndrome, [id number] showedfasting glucose was 123 and hgbA1C 7.2% , she was havingepisodes of moodiness, polyphagia, polydipsia and polyuria, that started June 2018 after placed on prednisone by Dr. Sabra Heck ( ortho)She has family history of DM ( 3 aunts, 2 uncles, brother, sister, daughter and maternal grandmother).She is on diet only, seen by dietician and hgbA1Cwent6.4% in 3 monthsfollowed by5.9% , 6.1% ,  5.8% ,6.1% , 5.8% and last visit 6.3% and has protein in the urine, we will recheck next visit and if remains elevated we will add ARB or ACE. She is cutting down on sweet tea but has been drinking juice and chocolate milk   Obesity: she changed her diet, seeing dietician at work, also exercising, discussed GLP-1 agonist, she denies family history of thyroid cancer.She denies personal history of pancreatitis.She is not interested on medications at this time. She states weight has been stable.   Alopecia:  she has a long history of hair loss, seen by Dermatologist and is now on topical steroids and finasteride she states it seems to be helping , still wearing a wig  Unchanged   AR: she has symptoms year round, doing much better since not going to work for the past 6 weeks, on furlough secondary to COVID-19   RA: seeing Dr. Meda Coffee, diagnosed in Feb 2018 , but started on Methotrexate March 2018, she seems to be tolerating medication,she states no longer having stiffness or swelling on her hands  Insomnia: no problems since she is not working, feeling rested   Headaches: she states she has recurrent headaches, started in her 30's, described as gradual onset, throbbing like, it can be temporal, frontal or nuchal area, when intense she has nausea and sound sensitivity, denies aura, brother has a history of migraine. She states it can last more than one day, no photophobia.Responding to Fioricet prn,  A few episodes only since last visit, doing very well now.   Hypothyroidism: taking 25 mcg dailyalternating with 50 mcg every other day, last TSH was back to normalWe will check TSH today, no change in bowel movements, she has chronic dry skin  Barretts Esophagus: controlled with pantoprazole,she denies heartburn or dysphagia. Doing well on medication   Dyslipidemia: LDL was 166and went down to 91 after she startedCrestor 08/2016. Continue 81 mg aspirin daily, no myalgias or chest pain . Reviewed labs done in January 2020   Patient Active Problem List   Diagnosis Date Noted  . Controlled type 2 diabetes  mellitus with microalbuminuria, without long-term current use of insulin ([place]) 02/09/2018  . Prediabetes 06/05/2017  . Seronegative rheumatoid arthritis ([place]) 06/30/2016  . Cyclic citrullinated peptide (CCP) antibody positive 03/14/2016  . Chondromalacia of both patellae 03/14/2016  . Acquired trigger finger 03/14/2016  . History of hysterectomy 08/08/2014  . Menopause 08/08/2014  .  History of iron deficiency anemia 08/08/2014  . Perennial allergic rhinitis 08/07/2014  . Barrett esophagus 08/07/2014  . Dyslipidemia 08/07/2014  . Family history of diabetes mellitus 08/07/2014  . Esophagitis, reflux 08/07/2014  . Acquired hypothyroidism 08/07/2014  . Dysmetabolic syndrome 72/62/0355  . Obesity (BMI 30-39.9) 08/07/2014  . Vitamin D deficiency 08/07/2014  . Tubular adenoma of colon 08/07/2014    Past Surgical History:  Procedure Laterality Date  . ABDOMINAL HYSTERECTOMY      Family History  Problem Relation Age of Onset  . Glaucoma Brother   . Stroke Paternal Grandfather   . Thyroid disease Father   . Heart disease Father   . Seizures Father   . Diabetes Sister   . Hypertension Sister   . Breast cancer Neg Hx     Social History   Socioeconomic History  . Marital status: Divorced    Spouse name: Not on file  . Number of children: 2  . Years of education: Not on file  . Highest education level: Associate degree: occupational, Hotel manager, or vocational program  Occupational History  . Not on file  Social Needs  . Financial resource strain: Not hard at all  . Food insecurity:    Worry: Never true    Inability: Never true  . Transportation needs:    Medical: No    Non-medical: No  Tobacco Use  . Smoking status: Never Smoker  . Smokeless tobacco: Never Used  Substance and Sexual Activity  . Alcohol use: No    Alcohol/week: 0.0 standard drinks  . Drug use: No  . Sexual activity: Yes    Partners: Male  Lifestyle  . Physical activity:    Days per week: 0 days    Minutes per session: 0 min  . Stress: Not at all  Relationships  . Social connections:    Talks on phone: More than three times a week    Gets together: More than three times a week    Attends religious service: More than 4 times per year    Active member of club or organization: Yes    Attends meetings of clubs or organizations: More than 4 times per year    Relationship status:  Divorced  . Intimate partner violence:    Fear of current or ex partner: No    Emotionally abused: No    Physically abused: No    Forced sexual activity: No  Other Topics Concern  . Not on file  Social History Narrative  . Not on file     Current Outpatient Medications:  .  aspirin (ASPIRIN ADULT LOW STRENGTH) 81 MG chewable tablet, Chew by mouth., Disp: , Rfl:  .  azelastine (ASTELIN) 0.1 % nasal spray, Place 2 sprays into both nostrils daily. Use in each nostril as directed, Disp: 30 mL, Rfl: 2 .  butalbital-acetaminophen-caffeine (FIORICET, ESGIC) 50-325-40 MG tablet, Take 1-2 tablets by mouth every 6 (six) hours as needed for headache., Disp: 30 tablet, Rfl: 0 .  Clobetasol Propionate (TEMOVATE) 0.05 % external spray, APPLY EVERY DAY ON SCALP, Disp: 59 mL, Rfl: 0 .  finasteride (PROSCAR) 5 MG tablet, Take 1 tablet by mouth daily.,  Disp: , Rfl:  .  fluticasone (FLONASE) 50 MCG/ACT nasal spray, Place 2 sprays into both nostrils daily., Disp: 48 g, Rfl: 1 .  folic acid (FOLVITE) 1 MG tablet, Take 1 mg by mouth daily., Disp: , Rfl: 4 .  levothyroxine (SYNTHROID, LEVOTHROID) 25 MCG tablet, TAKE 1-2 TABLETS BY MOUTH DAILY BEFORE BREAKFAST. ALTERNATE ONE PILL AND TWO PILLS EVERY OTHER DAY, Disp: 135 tablet, Rfl: 1 .  loratadine (CLARITIN) 10 MG tablet, Take 1 tablet (10 mg total) by mouth daily., Disp: 90 tablet, Rfl: 1 .  methotrexate (RHEUMATREX) 2.5 MG tablet, Take 2.5 mg by mouth once a week. 6 tablets at once a week-15 mg, Disp: , Rfl:  .  montelukast (SINGULAIR) 10 MG tablet, Take 1 tablet (10 mg total) by mouth at bedtime., Disp: 90 tablet, Rfl: 1 .  pantoprazole (PROTONIX) 40 MG tablet, Take 1 tablet (40 mg total) by mouth daily., Disp: 90 tablet, Rfl: 1 .  rosuvastatin (CRESTOR) 10 MG tablet, Take 1 tablet (10 mg total) by mouth daily., Disp: 90 tablet, Rfl: 1 .  Biotin 1000 MCG tablet, Take 1 tablet by mouth daily., Disp: , Rfl:   Allergies  Allergen Reactions  . Prednisone  Other (See Comments)    Elevated blood pressure, glucose spiked, mood swings  . Codeine Sulfate Other (See Comments)  . Latex Other (See Comments)    I personally reviewed active problem list, medication list, allergies, family history, social history with the patient/caregiver today.   ROS  Constitutional: Negative for fever or weight change.  Respiratory: Negative for cough and shortness of breath.   Cardiovascular: Negative for chest pain or palpitations.  Gastrointestinal: Negative for abdominal pain, no bowel changes.  Musculoskeletal: Negative for gait problem or joint swelling.  Skin: Negative for rash.  Neurological: Negative for dizziness or headache.  No other specific complaints in a complete review of systems (except as listed in HPI above).  Objective  Virtual encounter, vitals not obtained.  Body mass index is 31.25 kg/m.  Physical Exam  Awake, alert and oriented   PHQ2/9: Depression screen [facility] 2/9 06/11/2018 02/09/2018 10/11/2017 07/14/2017 03/15/2017  Decreased Interest 0 0 0 0 0  Down, Depressed, Hopeless 0 0 0 0 1  PHQ - 2 Score 0 0 0 0 1  Altered sleeping 1 - 0 0 -  Tired, decreased energy 0 - 0 1 -  Change in appetite 0 - 0 0 -  Feeling bad or failure about yourself  0 - 0 0 -  Trouble concentrating 0 - 0 0 -  Moving slowly or fidgety/restless 0 - 0 0 -  Suicidal thoughts 0 - 0 - -  PHQ-9 Score 1 - 0 1 -  Difficult doing work/chores Not difficult at all - Not difficult at all Not difficult at all -   PHQ-2/9 Result is negative.    Fall Risk: Fall Risk  06/11/2018 02/09/2018 10/11/2017 07/14/2017 03/15/2017  Falls in the past year? 0 0 No No No  Number falls in past yr: 0 - - - -  Injury with Fall? 0 - - - -    Assessment & Plan  1. Seronegative rheumatoid arthritis ([place])  Keep follow up with Dr. Meda Coffee   2. Controlled type 2 diabetes mellitus with microalbuminuria, without long-term current use of insulin ([place])  Discussed importance of stopping  juices and chocolate milk   3. Acquired hypothyroidism  Doing well, continue medication, reviewed labs done January TSH at goal   4. Barrett's  esophagus without dysplasia  Taking PPI  5. Dyslipidemia  Continue statin therapy   6. Alopecia  Under the care of dermatologist   7. Diabetes mellitus type 2 in obese [facility])  Discussed with the patient the risk posed by an increased BMI. Discussed importance of portion control, calorie counting and at least 150 minutes of physical activity weekly. Avoid sweet beverages and drink more water. Eat at least 6 servings of fruit and vegetables daily    I discussed the assessment and treatment plan with the patient. The patient was provided an opportunity to ask questions and all were answered. The patient agreed with the plan and demonstrated an understanding of the instructions.  The patient was advised to call back or seek an in-person evaluation if the symptoms worsen or if the condition fails to improve as anticipated.  I provided 25  minutes of non-face-to-face time during this encounter.

## 2018-07-23 ENCOUNTER — Other Ambulatory Visit: Payer: Self-pay | Admitting: Family Medicine

## 2018-07-23 DIAGNOSIS — Z1231 Encounter for screening mammogram for malignant neoplasm of breast: Secondary | ICD-10-CM

## 2018-08-08 LAB — HEMOGLOBIN A1C: Hemoglobin A1C: 7.4

## 2018-08-08 LAB — BASIC METABOLIC PANEL
BUN: 11 (ref 4–21)
Creatinine: 0.8 (ref 0.5–1.1)
Glucose: 170
Potassium: 3.6 (ref 3.4–5.3)
Sodium: 141 (ref 137–147)

## 2018-08-08 LAB — HEPATIC FUNCTION PANEL
ALT: 32 (ref 7–35)
AST: 25 (ref 13–35)

## 2018-08-08 LAB — LIPID PANEL
Cholesterol: 159 (ref 0–200)
HDL: 47 (ref 35–70)
LDL Cholesterol: 43
Triglycerides: 214 — AB (ref 40–160)

## 2018-08-08 LAB — TSH: TSH: 3.77 (ref 0.41–5.90)

## 2018-08-08 LAB — VITAMIN D 25 HYDROXY (VIT D DEFICIENCY, FRACTURES): Vit D, 25-Hydroxy: 19.9

## 2018-08-09 LAB — CBC AND DIFFERENTIAL
HCT: 40 (ref 36–46)
Hemoglobin: 13.2 (ref 12.0–16.0)
Platelets: 254 (ref 150–399)
WBC: 7.5

## 2018-08-10 ENCOUNTER — Encounter: Payer: Self-pay | Admitting: Family Medicine

## 2018-08-17 ENCOUNTER — Other Ambulatory Visit: Payer: Self-pay | Admitting: Family Medicine

## 2018-08-17 DIAGNOSIS — E785 Hyperlipidemia, unspecified: Secondary | ICD-10-CM

## 2018-08-29 ENCOUNTER — Ambulatory Visit
Admission: RE | Admit: 2018-08-29 | Discharge: 2018-08-29 | Disposition: A | Discharge status: Home / Self Care | Payer: BC Managed Care – PPO | Source: Ambulatory Visit | Attending: Family Medicine | Admitting: Family Medicine

## 2018-08-29 DIAGNOSIS — Z1231 Encounter for screening mammogram for malignant neoplasm of breast: Secondary | ICD-10-CM | POA: Diagnosis not present

## 2018-08-29 LAB — HM DIABETES EYE EXAM

## 2018-08-30 DIAGNOSIS — M6530 Trigger finger, unspecified finger: Secondary | ICD-10-CM | POA: Insufficient documentation

## 2018-08-30 DIAGNOSIS — Z79899 Other long term (current) drug therapy: Secondary | ICD-10-CM | POA: Diagnosis not present

## 2018-08-30 DIAGNOSIS — M0600 Rheumatoid arthritis without rheumatoid factor, unspecified site: Secondary | ICD-10-CM | POA: Diagnosis not present

## 2018-08-30 DIAGNOSIS — R768 Other specified abnormal immunological findings in serum: Secondary | ICD-10-CM | POA: Diagnosis not present

## 2018-08-31 ENCOUNTER — Encounter: Payer: Self-pay | Admitting: Family Medicine

## 2018-09-12 ENCOUNTER — Encounter: Payer: Self-pay | Admitting: Family Medicine

## 2018-09-12 ENCOUNTER — Ambulatory Visit (INDEPENDENT_AMBULATORY_CARE_PROVIDER_SITE_OTHER): Payer: BLUE CROSS/BLUE SHIELD | Admitting: Family Medicine

## 2018-09-12 ENCOUNTER — Other Ambulatory Visit: Payer: Self-pay

## 2018-09-12 VITALS — BP 108/72 | Wt 158.0 lb

## 2018-09-12 DIAGNOSIS — R519 Headache, unspecified: Secondary | ICD-10-CM

## 2018-09-12 DIAGNOSIS — J3089 Other allergic rhinitis: Secondary | ICD-10-CM

## 2018-09-12 DIAGNOSIS — E785 Hyperlipidemia, unspecified: Secondary | ICD-10-CM

## 2018-09-12 DIAGNOSIS — E669 Obesity, unspecified: Secondary | ICD-10-CM

## 2018-09-12 DIAGNOSIS — E1169 Type 2 diabetes mellitus with other specified complication: Secondary | ICD-10-CM

## 2018-09-12 DIAGNOSIS — K2270 Barrett's esophagus without dysplasia: Secondary | ICD-10-CM

## 2018-09-12 DIAGNOSIS — R51 Headache: Secondary | ICD-10-CM

## 2018-09-12 DIAGNOSIS — E039 Hypothyroidism, unspecified: Secondary | ICD-10-CM

## 2018-09-12 DIAGNOSIS — M0600 Rheumatoid arthritis without rheumatoid factor, unspecified site: Secondary | ICD-10-CM

## 2018-09-12 MED ORDER — LEVOTHYROXINE SODIUM 25 MCG PO TABS: ORAL_TABLET | ORAL | 1 refills | Status: DC

## 2018-09-12 MED ORDER — ROSUVASTATIN CALCIUM 10 MG PO TABS: 10.0000 mg | ORAL_TABLET | Freq: Every day | ORAL | 1 refills | Status: DC

## 2018-09-12 MED ORDER — BUTALBITAL-APAP-CAFFEINE 50-325-40 MG PO TABS: 1.0000 | ORAL_TABLET | Freq: Four times a day (QID) | ORAL | 0 refills | Status: DC | PRN

## 2018-09-12 MED ORDER — MONTELUKAST SODIUM 10 MG PO TABS: 10.0000 mg | ORAL_TABLET | Freq: Every day | ORAL | 1 refills | Status: DC

## 2018-09-12 MED ORDER — LORATADINE 10 MG PO TABS: 10.0000 mg | ORAL_TABLET | Freq: Every day | ORAL | 1 refills | Status: DC

## 2018-09-12 NOTE — Progress Notes (Signed)
Name: Colleen Curry   MRN: [id number]    DOB: [birth year]   Date:09/12/2018       Progress Note  Subjective  Chief Complaint  Chief Complaint  Patient presents with  . Arthritis    Hand pain. She was evaluated by [facility] and was started on Plaquenil. She has only been taking it for a few days and has not noticed alot of change.  . Diabetes  . Hypothyroidism  . Insomnia    I connected with  Lind Covert  on 09/12/18 at  8:20 AM EDT by a video enabled telemedicine application and verified that I am speaking with the correct person using two identifiers.  I discussed the limitations of evaluation and management by telemedicine and the availability of in person appointments. The patient expressed understanding and agreed to proceed. Staff also discussed with the patient that there may be a patient responsible charge related to this service. Patient Location: home  Provider Location: [facility]  Additional Individuals present: grand-daughter   HPI  DM II: she had a long history of metabolic syndrome, [id number] showedfasting glucose was 123 and hgbA1C 7.2% , she was havingepisodes of moodiness, polyphagia, polydipsia and polyuria, that started June 2018 after placed on prednisone by Dr. Sabra Heck ( ortho)She has family history of DM ( 3 aunts, 2 uncles, brother, sister, daughter and maternal grandmother).She is on diet only, seen by dietician in the past  hgbA1Cwent6.4% in 3 monthsfollowed by5.9% , 6.1%,5.8% ,6.1% , 5.8% , 6.3% but went up to 7.4% in July 2020, she states she was not following a diabetic diet but since she has been back to work she is back on schedule.  and has protein in the urine, we will recheck next visit and if remains elevated we will add ARB or ACE. We will recheck it when she is in our office. Eye exam is up to date   Obesity:She is not interested on medications at this time. She states weight has been stable. She has changed her  eating habits since she went back to work May 2020   Alopecia: she has a long history of hair loss, she was seen by Dermatologist but released since Dec. She was given clobetasol and also has steroids injections  RA: seeing Dr. Meda Coffee, diagnosed in Feb2018, but started on Methotrexate March 2018, she is also on Plaquenil , started in 2020. She states plaquenil seems to drop her sugar a little but otherwise tolerating it well   Insomnia: she is back at work but is still sleeping well, occasionally waking up during the night   Headaches: she states she has recurrent headaches, started in her 30's, described as gradual onset, throbbing like, it can be temporal, frontal or nuchal area, when intense she has nausea and sound sensitivity, denies aura, brother has a history of migraine. She states it can last more than one day, no photophobia.Responding to Fioricet prn. Unchanged   Hypothyroidism: taking 25 mcg dailyalternating with 50 mcg every other day, last TSH was towards high need of normal, no change in bowel movements, she has chronic dry skin  Barretts Esophagus: controlled with pantoprazole,she denies heartburn or dysphagia. Doing well on medication Unchanged   Dyslipidemia: LDL was 166and went down to 91 after she startedCrestor 08/2016. Continue 81 mg aspirin daily, no myalgias or chest pain .Last labs done 07/2018 and doing well   Patient Active Problem List   Diagnosis Date Noted  . Controlled type 2 diabetes mellitus with  microalbuminuria, without long-term current use of insulin ([place]) 02/09/2018  . Prediabetes 06/05/2017  . Seronegative rheumatoid arthritis ([place]) 06/30/2016  . Cyclic citrullinated peptide (CCP) antibody positive 03/14/2016  . Chondromalacia of both patellae 03/14/2016  . Acquired trigger finger 03/14/2016  . History of hysterectomy 08/08/2014  . Menopause 08/08/2014  . History of iron deficiency anemia 08/08/2014  . Perennial allergic rhinitis  08/07/2014  . Barrett esophagus 08/07/2014  . Dyslipidemia 08/07/2014  . Family history of diabetes mellitus 08/07/2014  . Esophagitis, reflux 08/07/2014  . Acquired hypothyroidism 08/07/2014  . Dysmetabolic syndrome 13/08/6576  . Obesity (BMI 30-39.9) 08/07/2014  . Vitamin D deficiency 08/07/2014  . Tubular adenoma of colon 08/07/2014    Past Surgical History:  Procedure Laterality Date  . ABDOMINAL HYSTERECTOMY      Family History  Problem Relation Age of Onset  . Glaucoma Brother   . Stroke Paternal Grandfather   . Thyroid disease Father   . Heart disease Father   . Seizures Father   . Diabetes Sister   . Hypertension Sister   . Breast cancer Neg Hx     Social History   Socioeconomic History  . Marital status: Divorced    Spouse name: Not on file  . Number of children: 2  . Years of education: Not on file  . Highest education level: Associate degree: occupational, Hotel manager, or vocational program  Occupational History  . Not on file  Social Needs  . Financial resource strain: Not hard at all  . Food insecurity    Worry: Never true    Inability: Never true  . Transportation needs    Medical: No    Non-medical: No  Tobacco Use  . Smoking status: Never Smoker  . Smokeless tobacco: Never Used  Substance and Sexual Activity  . Alcohol use: No    Alcohol/week: 0.0 standard drinks  . Drug use: No  . Sexual activity: Yes    Partners: Male  Lifestyle  . Physical activity    Days per week: 0 days    Minutes per session: 0 min  . Stress: Not at all  Relationships  . Social connections    Talks on phone: More than three times a week    Gets together: More than three times a week    Attends religious service: More than 4 times per year    Active member of club or organization: Yes    Attends meetings of clubs or organizations: More than 4 times per year    Relationship status: Divorced  . Intimate partner violence    Fear of current or ex partner: No     Emotionally abused: No    Physically abused: No    Forced sexual activity: No  Other Topics Concern  . Not on file  Social History Narrative  . Not on file     Current Outpatient Medications:  .  aspirin (ASPIRIN ADULT LOW STRENGTH) 81 MG chewable tablet, Chew by mouth., Disp: , Rfl:  .  azelastine (ASTELIN) 0.1 % nasal spray, Place 2 sprays into both nostrils daily. Use in each nostril as directed, Disp: 30 mL, Rfl: 2 .  Biotin 1000 MCG tablet, Take 1 tablet by mouth daily., Disp: , Rfl:  .  butalbital-acetaminophen-caffeine (FIORICET, ESGIC) 50-325-40 MG tablet, Take 1-2 tablets by mouth every 6 (six) hours as needed for headache., Disp: 30 tablet, Rfl: 0 .  Clobetasol Propionate (TEMOVATE) 0.05 % external spray, APPLY EVERY DAY ON SCALP, Disp: 59 mL, Rfl:  0 .  finasteride (PROSCAR) 5 MG tablet, Take 1 tablet by mouth daily., Disp: , Rfl:  .  fluticasone (FLONASE) 50 MCG/ACT nasal spray, Place 2 sprays into both nostrils daily., Disp: 48 g, Rfl: 1 .  folic acid (FOLVITE) 1 MG tablet, Take 1 mg by mouth daily., Disp: , Rfl: 4 .  hydroxychloroquine (PLAQUENIL) 200 MG tablet, One tab twice a day for rheumatoid arthritis, 30 days, 60 tabs, Disp: , Rfl:  .  levothyroxine (SYNTHROID, LEVOTHROID) 25 MCG tablet, TAKE 1-2 TABLETS BY MOUTH DAILY BEFORE BREAKFAST. ALTERNATE ONE PILL AND TWO PILLS EVERY OTHER DAY, Disp: 135 tablet, Rfl: 1 .  loratadine (CLARITIN) 10 MG tablet, Take 1 tablet (10 mg total) by mouth daily., Disp: 90 tablet, Rfl: 1 .  methotrexate (RHEUMATREX) 2.5 MG tablet, Take 2.5 mg by mouth once a week. 7 tablets at once a week-15 mg, Disp: , Rfl:  .  montelukast (SINGULAIR) 10 MG tablet, Take 1 tablet (10 mg total) by mouth at bedtime., Disp: 90 tablet, Rfl: 1 .  pantoprazole (PROTONIX) 40 MG tablet, Take 1 tablet (40 mg total) by mouth daily., Disp: 90 tablet, Rfl: 1 .  rosuvastatin (CRESTOR) 10 MG tablet, TAKE 1 TABLET BY MOUTH EVERY DAY, Disp: 90 tablet, Rfl: 0  Allergies   Allergen Reactions  . Prednisone Other (See Comments)    Elevated blood pressure, glucose spiked, mood swings  . Codeine Sulfate Other (See Comments)  . Latex Other (See Comments)    I personally reviewed active problem list, medication list, allergies, family history, social history with the patient/caregiver today.   ROS  Ten systems reviewed and is negative except as mentioned in HPI   Objective  Virtual encounter, vitals not obtained.  There is no height or weight on file to calculate BMI.  Physical Exam  Awake, alert and oriented  PHQ2/9: Depression screen [facility] 2/9 09/12/2018 06/11/2018 02/09/2018 10/11/2017 07/14/2017  Decreased Interest 0 0 0 0 0  Down, Depressed, Hopeless 0 0 0 0 0  PHQ - 2 Score 0 0 0 0 0  Altered sleeping 0 1 - 0 0  Tired, decreased energy 0 0 - 0 1  Change in appetite 0 0 - 0 0  Feeling bad or failure about yourself  0 0 - 0 0  Trouble concentrating 0 0 - 0 0  Moving slowly or fidgety/restless 0 0 - 0 0  Suicidal thoughts 0 0 - 0 -  PHQ-9 Score 0 1 - 0 1  Difficult doing work/chores - Not difficult at all - Not difficult at all Not difficult at all   PHQ-2/9 Result is negative.    Fall Risk: Fall Risk  09/12/2018 06/11/2018 02/09/2018 10/11/2017 07/14/2017  Falls in the past year? 0 0 0 No No  Number falls in past yr: 0 0 - - -  Injury with Fall? 0 0 - - -     Assessment & Plan  1. Acquired hypothyroidism  - levothyroxine (SYNTHROID) 25 MCG tablet; TAKE 1-2 TABLETS BY MOUTH DAILY BEFORE BREAKFAST. ALTERNATE ONE PILL AND TWO PILLS EVERY OTHER DAY  Dispense: 135 tablet; Refill: 1  2. Perennial allergic rhinitis  - montelukast (SINGULAIR) 10 MG tablet; Take 1 tablet (10 mg total) by mouth at bedtime.  Dispense: 90 tablet; Refill: 1 - loratadine (CLARITIN) 10 MG tablet; Take 1 tablet (10 mg total) by mouth daily.  Dispense: 90 tablet; Refill: 1  3. Dyslipidemia  - rosuvastatin (CRESTOR) 10 MG tablet; Take 1 tablet (10  mg total) by mouth  daily.  Dispense: 90 tablet; Refill: 1  4. Headache, unspecified headache type  - butalbital-acetaminophen-caffeine (FIORICET) 50-325-40 MG tablet; Take 1-2 tablets by mouth every 6 (six) hours as needed for headache.  Dispense: 30 tablet; Refill: 0  5. Barrett's esophagus without dysplasia  Doing well  6. Seronegative rheumatoid arthritis ([place])  Under the care of Dr. Meda Coffee   7. Diabetes mellitus type 2 in obese ([facility])  - Microalbumin / creatinine urine ratio - Hemoglobin A1c - Comprehensive metabolic panel  I discussed the assessment and treatment plan with the patient. The patient was provided an opportunity to ask questions and all were answered. The patient agreed with the plan and demonstrated an understanding of the instructions.  The patient was advised to call back or seek an in-person evaluation if the symptoms worsen or if the condition fails to improve as anticipated.  I provided 25 minutes of non-face-to-face time during this encounter.

## 2018-09-14 ENCOUNTER — Other Ambulatory Visit: Payer: Self-pay

## 2018-09-14 DIAGNOSIS — J3089 Other allergic rhinitis: Secondary | ICD-10-CM

## 2018-09-14 NOTE — Telephone Encounter (Signed)
[facility] is requesting another copy of the prescription to be sent in to the pharmacy.

## 2018-09-16 MED ORDER — LORATADINE 10 MG PO TABS: 10.0000 mg | ORAL_TABLET | Freq: Every day | ORAL | 1 refills | Status: DC

## 2018-10-03 ENCOUNTER — Other Ambulatory Visit: Payer: Self-pay | Admitting: Family Medicine

## 2018-10-03 DIAGNOSIS — K2270 Barrett's esophagus without dysplasia: Secondary | ICD-10-CM

## 2018-10-04 LAB — CBC AND DIFFERENTIAL
HCT: 39 (ref 36–46)
Hemoglobin: 12.8 (ref 12.0–16.0)
Neutrophils Absolute: 4
Platelets: 263 (ref 150–399)
WBC: 7.4

## 2018-10-04 LAB — BASIC METABOLIC PANEL
BUN: 11 (ref 4–21)
Creatinine: 0.8 (ref 0.5–1.1)
Glucose: 94
Potassium: 4 (ref 3.4–5.3)
Sodium: 143 (ref 137–147)

## 2018-10-04 LAB — TSH: TSH: 4.02 (ref 0.41–5.90)

## 2018-10-04 LAB — HEMOGLOBIN A1C: Hemoglobin A1C: 6.8

## 2018-10-05 ENCOUNTER — Encounter: Payer: Self-pay | Admitting: Family Medicine

## 2018-10-08 ENCOUNTER — Encounter: Payer: Self-pay | Admitting: Family Medicine

## 2018-11-29 LAB — MICROALBUMIN, URINE: Microalb, Ur: 21.5

## 2018-12-11 ENCOUNTER — Other Ambulatory Visit: Payer: Self-pay

## 2018-12-11 ENCOUNTER — Ambulatory Visit (INDEPENDENT_AMBULATORY_CARE_PROVIDER_SITE_OTHER): Payer: Self-pay | Admitting: Family Medicine

## 2018-12-11 ENCOUNTER — Encounter: Payer: Self-pay | Admitting: Family Medicine

## 2018-12-11 VITALS — BP 100/60 | HR 88 | Temp 98.2°F | Resp 16 | Ht 60.0 in | Wt 153.5 lb

## 2018-12-11 DIAGNOSIS — M0600 Rheumatoid arthritis without rheumatoid factor, unspecified site: Secondary | ICD-10-CM

## 2018-12-11 DIAGNOSIS — E1169 Type 2 diabetes mellitus with other specified complication: Secondary | ICD-10-CM

## 2018-12-11 DIAGNOSIS — L659 Nonscarring hair loss, unspecified: Secondary | ICD-10-CM

## 2018-12-11 DIAGNOSIS — E785 Hyperlipidemia, unspecified: Secondary | ICD-10-CM

## 2018-12-11 DIAGNOSIS — E039 Hypothyroidism, unspecified: Secondary | ICD-10-CM

## 2018-12-11 DIAGNOSIS — E663 Overweight: Secondary | ICD-10-CM

## 2018-12-11 DIAGNOSIS — K2270 Barrett's esophagus without dysplasia: Secondary | ICD-10-CM

## 2018-12-11 NOTE — Progress Notes (Signed)
Name: Colleen Curry   MRN: [id number]    DOB: [birth year]   Date:12/11/2018       Progress Note  Subjective  Chief Complaint  Chief Complaint  Patient presents with  . Gastroesophageal Reflux  . Hypothyroidism  . Dyslipidemia  . Diabetes    I connected with  Lind Covert  on 12/11/18 at  2:20 PM EST by a video enabled telemedicine application and verified that I am speaking with the correct person using two identifiers.  I discussed the limitations of evaluation and management by telemedicine and the availability of in person appointments. The patient expressed understanding and agreed to proceed. Staff also discussed with the patient that there may be a patient responsible charge related to this service. Patient Location: at home  Provider Location: [facility]    HPI  DM II: she had a long history of metabolic syndrome, [id number] showedfasting glucose was 123 and hgbA1C 7.2% , she was havingepisodes of moodiness, polyphagia, polydipsia and polyuria, that started June 2018 after placed on prednisone by Dr. Sabra Heck ( ortho)She has family history of DM ( 3 aunts, 2 uncles, brother, sister, daughter and maternal grandmother). Her  A1C spike in July at   7.4% in July 2020, she states she was not following a diabetic diet but since she has been back to work she is back on schedule. Repeat level in September showed A1C down to 6.8%. She is doing very well with life style modification, last urine micro back to normal and she is not on ACE/ARB. Dyslipidemia controlled now with Crestor.   Overweight: :She is not interested on medications at this time. Shestates weight has been stable. She has changed her eating habits since she went back to work May 2020  Her weight is good, BMI is below 30 now. She stopped having sweets, desserts, sodas and is doing well   Alopecia: she has a long history of hair loss, she was seen by Dermatologist but released since 2019.  She  was given clobetasol and also had steroids injections but did not improve much  RA: seeing Dr. Meda Coffee, diagnosed in Feb2018, but started on Methotrexate March 2018, she is also on Plaquenil , started Fall 2020. She states plaquenil seems to drop her sugar initially  a little  she states she has noticed pruritis that happens a few times a week and started after she started after she started medication, advised to discuss it with Dr. Meda Coffee   Insomnia:she is back at work but is still sleeping well,  No problems recently   Headaches: she states she has recurrent headaches, started in her 30's, described as gradual onset, throbbing like, it can be temporal, frontal or nuchal area, when intense she has nausea and sound sensitivity, denies aura, brother has a history of migraine. She states it can last more than one day, no photophobia.Responding to Fioricet prn. She had two episodes this week. She thinks secondary to cold  Hypothyroidism: taking 25 mcg dailyalternating with 50 mcg every other day, last TSH was towards high need of normal, no change in bowel movements, she has chronic dry skin, advised to have repeat test done at work   Barretts Esophagus: controlled with pantoprazole,she denies heartburn or dysphagia.Controlled   Dyslipidemia: LDL was down to 43, HDL was good at 47 , Currently on Crestor and aspirin and no side effects    Patient Active Problem List   Diagnosis Date Noted  . Controlled type 2 diabetes mellitus  with microalbuminuria, without long-term current use of insulin ([place]) 02/09/2018  . Prediabetes 06/05/2017  . Seronegative rheumatoid arthritis ([place]) 06/30/2016  . Cyclic citrullinated peptide (CCP) antibody positive 03/14/2016  . Chondromalacia of both patellae 03/14/2016  . Acquired trigger finger 03/14/2016  . History of hysterectomy 08/08/2014  . Menopause 08/08/2014  . History of iron deficiency anemia 08/08/2014  . Perennial allergic rhinitis 08/07/2014  .  Barrett esophagus 08/07/2014  . Dyslipidemia 08/07/2014  . Family history of diabetes mellitus 08/07/2014  . Esophagitis, reflux 08/07/2014  . Acquired hypothyroidism 08/07/2014  . Dysmetabolic syndrome 123456  . Obesity (BMI 30-39.9) 08/07/2014  . Vitamin D deficiency 08/07/2014  . Tubular adenoma of colon 08/07/2014    Past Surgical History:  Procedure Laterality Date  . ABDOMINAL HYSTERECTOMY      Family History  Problem Relation Age of Onset  . Glaucoma Brother   . Stroke Paternal Grandfather   . Thyroid disease Father   . Heart disease Father   . Seizures Father   . Atrial fibrillation Father   . Diabetes Sister   . Hypertension Sister   . Breast cancer Neg Hx     Social History   Socioeconomic History  . Marital status: Divorced    Spouse name: Not on file  . Number of children: 2  . Years of education: Not on file  . Highest education level: Associate degree: occupational, Hotel manager, or vocational program  Occupational History  . Not on file  Social Needs  . Financial resource strain: Not hard at all  . Food insecurity    Worry: Never true    Inability: Never true  . Transportation needs    Medical: No    Non-medical: No  Tobacco Use  . Smoking status: Never Smoker  . Smokeless tobacco: Never Used  Substance and Sexual Activity  . Alcohol use: No    Alcohol/week: 0.0 standard drinks  . Drug use: No  . Sexual activity: Yes    Partners: Male  Lifestyle  . Physical activity    Days per week: 0 days    Minutes per session: 0 min  . Stress: Not at all  Relationships  . Social connections    Talks on phone: More than three times a week    Gets together: More than three times a week    Attends religious service: More than 4 times per year    Active member of club or organization: Yes    Attends meetings of clubs or organizations: More than 4 times per year    Relationship status: Divorced  . Intimate partner violence    Fear of current or ex  partner: No    Emotionally abused: No    Physically abused: No    Forced sexual activity: No  Other Topics Concern  . Not on file  Social History Narrative  . Not on file     Current Outpatient Medications:  .  aspirin (ASPIRIN ADULT LOW STRENGTH) 81 MG chewable tablet, Chew by mouth., Disp: , Rfl:  .  azelastine (ASTELIN) 0.1 % nasal spray, Place 2 sprays into both nostrils daily. Use in each nostril as directed, Disp: 30 mL, Rfl: 2 .  butalbital-acetaminophen-caffeine (FIORICET) 50-325-40 MG tablet, Take 1-2 tablets by mouth every 6 (six) hours as needed for headache., Disp: 30 tablet, Rfl: 0 .  fluticasone (FLONASE) 50 MCG/ACT nasal spray, Place 2 sprays into both nostrils daily., Disp: 48 g, Rfl: 1 .  folic acid (FOLVITE) 1 MG tablet,  Take 1 mg by mouth daily., Disp: , Rfl: 4 .  hydroxychloroquine (PLAQUENIL) 200 MG tablet, One tab twice a day for rheumatoid arthritis, 30 days, 60 tabs, Disp: , Rfl:  .  levothyroxine (SYNTHROID) 25 MCG tablet, TAKE 1-2 TABLETS BY MOUTH DAILY BEFORE BREAKFAST. ALTERNATE ONE PILL AND TWO PILLS EVERY OTHER DAY, Disp: 135 tablet, Rfl: 1 .  loratadine (CLARITIN) 10 MG tablet, Take 1 tablet (10 mg total) by mouth daily., Disp: 90 tablet, Rfl: 1 .  methotrexate (RHEUMATREX) 2.5 MG tablet, Take 2.5 mg by mouth once a week. 7 tablets at once a week-15 mg, Disp: , Rfl:  .  montelukast (SINGULAIR) 10 MG tablet, Take 1 tablet (10 mg total) by mouth at bedtime., Disp: 90 tablet, Rfl: 1 .  pantoprazole (PROTONIX) 40 MG tablet, TAKE 1 TABLET BY MOUTH EVERY DAY, Disp: 90 tablet, Rfl: 1 .  rosuvastatin (CRESTOR) 10 MG tablet, Take 1 tablet (10 mg total) by mouth daily., Disp: 90 tablet, Rfl: 1 .  Biotin 1000 MCG tablet, Take 1 tablet by mouth daily., Disp: , Rfl:  .  finasteride (PROSCAR) 5 MG tablet, Take 1 tablet by mouth daily., Disp: , Rfl:   Allergies  Allergen Reactions  . Prednisone Other (See Comments)    Elevated blood pressure, glucose spiked, mood swings   . Codeine Sulfate Other (See Comments)  . Latex Other (See Comments)    I personally reviewed active problem list, medication list, allergies, family history, social history, health maintenance with the patient/caregiver today.   ROS  Ten systems reviewed and is negative except as mentioned in HPI   Objective  Virtual encounter, vitals done at home  Today's Vitals   12/11/18 1442  BP: 100/60  Pulse: 88  Resp: 16  Temp: 98.2 F (36.8 C)  TempSrc: Temporal  SpO2: 98%  Weight: 153 lb 8 oz (69.6 kg)  Height: 5' (1.524 m)   Body mass index is 29.98 kg/m.Marland Kitchen   Physical Exam  Awake , alert and oriented  PHQ2/9: Depression screen [facility] 2/9 12/11/2018 09/12/2018 06/11/2018 02/09/2018 10/11/2017  Decreased Interest 0 0 0 0 0  Down, Depressed, Hopeless 0 0 0 0 0  PHQ - 2 Score 0 0 0 0 0  Altered sleeping 0 0 1 - 0  Tired, decreased energy 0 0 0 - 0  Change in appetite 0 0 0 - 0  Feeling bad or failure about yourself  0 0 0 - 0  Trouble concentrating 0 0 0 - 0  Moving slowly or fidgety/restless 0 0 0 - 0  Suicidal thoughts 0 0 0 - 0  PHQ-9 Score 0 0 1 - 0  Difficult doing work/chores - - Not difficult at all - Not difficult at all   PHQ-2/9 Result is negative.    Fall Risk: Fall Risk  09/12/2018 06/11/2018 02/09/2018 10/11/2017 07/14/2017  Falls in the past year? 0 0 0 No No  Number falls in past yr: 0 0 - - -  Injury with Fall? 0 0 - - -     Assessment & Plan  1. Acquired hypothyroidism  She will have TSH rechecked at work and send me a copy   2. Barrett's esophagus without dysplasia  Doing well on medication   3. Seronegative rheumatoid arthritis ([place])  Keep follow up with Dr. Meda Coffee, and notify her of pruritis with plaquenil   4. Alopecia  Wearing a wig, clobetasol did not help much   5. Overweight (BMI 25.0-29.9)  Continue life  style modification   6. Dyslipidemia associated with type 2 diabetes mellitus ([place])  I am proud of her for getting A1C below 7%  with life style modification   I discussed the assessment and treatment plan with the patient. The patient was provided an opportunity to ask questions and all were answered. The patient agreed with the plan and demonstrated an understanding of the instructions.  The patient was advised to call back or seek an in-person evaluation if the symptoms worsen or if the condition fails to improve as anticipated.  I provided 25  minutes of non-face-to-face time during this encounter.

## 2019-01-02 DIAGNOSIS — M0600 Rheumatoid arthritis without rheumatoid factor, unspecified site: Secondary | ICD-10-CM | POA: Diagnosis not present

## 2019-01-22 LAB — HEPATIC FUNCTION PANEL
ALT: 25 (ref 7–35)
AST: 22 (ref 13–35)

## 2019-01-22 LAB — CBC AND DIFFERENTIAL
HCT: 39 (ref 36–46)
Hemoglobin: 13.3 (ref 12.0–16.0)
Platelets: 277 (ref 150–399)
WBC: 6.6

## 2019-01-22 LAB — CBC: RBC: 4.32 (ref 3.87–5.11)

## 2019-01-22 LAB — TSH: TSH: 2.43 (ref 0.41–5.90)

## 2019-01-22 LAB — BASIC METABOLIC PANEL: Creatinine: 1 (ref 0.5–1.1)

## 2019-01-28 ENCOUNTER — Encounter: Payer: Self-pay | Admitting: Family Medicine

## 2019-02-10 ENCOUNTER — Other Ambulatory Visit: Payer: Self-pay | Admitting: Family Medicine

## 2019-02-10 DIAGNOSIS — R519 Headache, unspecified: Secondary | ICD-10-CM

## 2019-02-12 NOTE — Telephone Encounter (Signed)
lvm for scheduling °

## 2019-02-27 ENCOUNTER — Encounter: Payer: Self-pay | Admitting: Family Medicine

## 2019-02-27 ENCOUNTER — Other Ambulatory Visit: Payer: Self-pay

## 2019-02-27 ENCOUNTER — Ambulatory Visit (INDEPENDENT_AMBULATORY_CARE_PROVIDER_SITE_OTHER): Payer: BC Managed Care – PPO | Admitting: Family Medicine

## 2019-02-27 VITALS — BP 104/70 | HR 85 | Temp 96.9°F | Resp 16 | Ht 61.0 in | Wt 150.2 lb

## 2019-02-27 DIAGNOSIS — E039 Hypothyroidism, unspecified: Secondary | ICD-10-CM

## 2019-02-27 DIAGNOSIS — R519 Headache, unspecified: Secondary | ICD-10-CM

## 2019-02-27 DIAGNOSIS — E1129 Type 2 diabetes mellitus with other diabetic kidney complication: Secondary | ICD-10-CM

## 2019-02-27 DIAGNOSIS — E663 Overweight: Secondary | ICD-10-CM

## 2019-02-27 DIAGNOSIS — K2270 Barrett's esophagus without dysplasia: Secondary | ICD-10-CM | POA: Diagnosis not present

## 2019-02-27 DIAGNOSIS — R809 Proteinuria, unspecified: Secondary | ICD-10-CM | POA: Diagnosis not present

## 2019-02-27 DIAGNOSIS — E785 Hyperlipidemia, unspecified: Secondary | ICD-10-CM

## 2019-02-27 DIAGNOSIS — M0600 Rheumatoid arthritis without rheumatoid factor, unspecified site: Secondary | ICD-10-CM

## 2019-02-27 DIAGNOSIS — E559 Vitamin D deficiency, unspecified: Secondary | ICD-10-CM

## 2019-02-27 DIAGNOSIS — E1169 Type 2 diabetes mellitus with other specified complication: Secondary | ICD-10-CM

## 2019-02-27 DIAGNOSIS — J3089 Other allergic rhinitis: Secondary | ICD-10-CM

## 2019-02-27 LAB — POCT GLYCOSYLATED HEMOGLOBIN (HGB A1C): HbA1c, POC (controlled diabetic range): 5.7 % (ref 0.0–7.0)

## 2019-02-27 MED ORDER — AZELASTINE HCL 0.1 % NA SOLN: 2.0000 | Freq: Every day | NASAL | 2 refills | Status: DC

## 2019-02-27 MED ORDER — LORATADINE 10 MG PO TABS: 10.0000 mg | ORAL_TABLET | Freq: Every day | ORAL | 1 refills | Status: DC

## 2019-02-27 MED ORDER — MONTELUKAST SODIUM 10 MG PO TABS: 10.0000 mg | ORAL_TABLET | Freq: Every day | ORAL | 1 refills | Status: DC

## 2019-02-27 MED ORDER — LEVOTHYROXINE SODIUM 25 MCG PO TABS: ORAL_TABLET | ORAL | 1 refills | Status: DC

## 2019-02-27 MED ORDER — ROSUVASTATIN CALCIUM 10 MG PO TABS: 10.0000 mg | ORAL_TABLET | Freq: Every day | ORAL | 1 refills | Status: DC

## 2019-02-27 MED ORDER — BUTALBITAL-APAP-CAFFEINE 50-325-40 MG PO TABS: 1.0000 | ORAL_TABLET | Freq: Four times a day (QID) | ORAL | 0 refills | Status: DC | PRN

## 2019-02-27 MED ORDER — FLUTICASONE PROPIONATE 50 MCG/ACT NA SUSP: 2.0000 | Freq: Every day | NASAL | 2 refills | Status: DC

## 2019-02-27 MED ORDER — VITAMIN D 50 MCG (2000 UT) PO CAPS: 1.0000 | ORAL_CAPSULE | Freq: Every day | ORAL | 12 refills | Status: AC

## 2019-02-27 MED ORDER — PANTOPRAZOLE SODIUM 40 MG PO TBEC: 40.0000 mg | DELAYED_RELEASE_TABLET | Freq: Every day | ORAL | 1 refills | Status: DC

## 2019-02-27 NOTE — Progress Notes (Signed)
Name: Colleen Curry   MRN: [id number]    DOB: 1955/02/25   Date:02/27/2019       Progress Note  Subjective  Chief Complaint  Chief Complaint  Patient presents with  . Diabetes  . Dyslipidemia  . Hypothyroidism    HPI  DM II: she had a long history of metabolic syndrome, [id number] showedfasting glucose was 123 and hgbA1C 7.2% , she was havingepisodes of moodiness, polyphagia, polydipsia and polyuria, that started June 2018 after placed on prednisone by Dr. Sabra Heck ( ortho)She has family history of DM ( 3 aunts, 2 uncles, brother, sister, daughter and maternal grandmother). Her  A1C spike in July at   7.4% in July 2020, she states she was not following a diabetic diet at the time, she resumed a diabetic diet and A1C was 6.9 % in September 2020  . She states since last visit not eating pasta, white bread and drinking diet sodas and A1C is down to 5.7 %. She was never on diabetic medication. On crestor for cholesterol.   Overweight:  Shestates weight has been stable.She has changed her eating habits since she went back to work May 2020 Her weight is good, BMI is below 30 now.Continue life style modification   Alopecia: she has a long history of hair loss,she was seen by Dermatologist but released since 2019.  She was given clobetasol and also had steroids injections but did not improve much, she wears a wig   RA: seeing Dr. Meda Coffee, diagnosed in Feb2018, but started on Methotrexate March 2018 and Plaquenil was added  Fall 2020. She states plaquenil seems to drop her sugar initially  a little  she states initially she has a lot of pruritis with plaquenil but is doing better. She states only joints that still bothers her are her fingers and hand stays swollen, she is discussing other options with Dr. Meda Coffee and has an upcoming appointment   Headaches: she states she has recurrent headaches, started in her 57's, described as gradual onset, throbbing like, it can be temporal, frontal or  nuchal area, when intense she has nausea and sound sensitivity, denies aura, brother has a history of migraine. No recent episodes. Taking Fioricet prn , she needs a refill today   Hypothyroidism: taking 25 mcg dailyalternating with 50 mcg every other day, last TSH was at goal at2.43   Barretts Esophagus: controlled with pantoprazole,she denies heartburn or dysphagia.Controlled , she has follow up with GI next week for colonoscopy   Dyslipidemia: LDL was down to 43, HDL was good at 47 , Currently on Crestor and aspirin and no side effects . Unchanged    Patient Active Problem List   Diagnosis Date Noted  . Controlled type 2 diabetes mellitus with microalbuminuria, without long-term current use of insulin ([place]) 02/09/2018  . Prediabetes 06/05/2017  . Seronegative rheumatoid arthritis ([place]) 06/30/2016  . Cyclic citrullinated peptide (CCP) antibody positive 03/14/2016  . Chondromalacia of both patellae 03/14/2016  . Acquired trigger finger 03/14/2016  . History of hysterectomy 08/08/2014  . Menopause 08/08/2014  . History of iron deficiency anemia 08/08/2014  . Perennial allergic rhinitis 08/07/2014  . Barrett esophagus 08/07/2014  . Dyslipidemia 08/07/2014  . Family history of diabetes mellitus 08/07/2014  . Esophagitis, reflux 08/07/2014  . Acquired hypothyroidism 08/07/2014  . Dysmetabolic syndrome 123456  . Obesity (BMI 30-39.9) 08/07/2014  . Vitamin D deficiency 08/07/2014  . Tubular adenoma of colon 08/07/2014    Past Surgical History:  Procedure Laterality Date  . ABDOMINAL  HYSTERECTOMY      Family History  Problem Relation Age of Onset  . Glaucoma Brother   . Stroke Paternal Grandfather   . Thyroid disease Father   . Heart disease Father   . Seizures Father   . Atrial fibrillation Father   . Diabetes Sister   . Hypertension Sister   . Breast cancer Neg Hx      Current Outpatient Medications:  .  aspirin (ASPIRIN ADULT LOW STRENGTH) 81 MG chewable  tablet, Chew by mouth., Disp: , Rfl:  .  azelastine (ASTELIN) 0.1 % nasal spray, Place 2 sprays into both nostrils daily. Use in each nostril as directed, Disp: 30 mL, Rfl: 2 .  finasteride (PROSCAR) 5 MG tablet, Take 1 tablet by mouth daily., Disp: , Rfl:  .  fluticasone (FLONASE) 50 MCG/ACT nasal spray, Place 2 sprays into both nostrils daily., Disp: 48 g, Rfl: 1 .  folic acid (FOLVITE) 1 MG tablet, Take 1 mg by mouth daily., Disp: , Rfl: 4 .  hydroxychloroquine (PLAQUENIL) 200 MG tablet, One tab twice a day for rheumatoid arthritis, 30 days, 60 tabs, Disp: , Rfl:  .  levothyroxine (SYNTHROID) 25 MCG tablet, TAKE 1-2 TABLETS BY MOUTH DAILY BEFORE BREAKFAST. ALTERNATE ONE PILL AND TWO PILLS EVERY OTHER DAY, Disp: 135 tablet, Rfl: 1 .  loratadine (CLARITIN) 10 MG tablet, Take 1 tablet (10 mg total) by mouth daily., Disp: 90 tablet, Rfl: 1 .  methotrexate (RHEUMATREX) 2.5 MG tablet, Take 2.5 mg by mouth once a week. 7 tablets at once a week-15 mg, Disp: , Rfl:  .  montelukast (SINGULAIR) 10 MG tablet, Take 1 tablet (10 mg total) by mouth at bedtime., Disp: 90 tablet, Rfl: 1 .  pantoprazole (PROTONIX) 40 MG tablet, TAKE 1 TABLET BY MOUTH EVERY DAY, Disp: 90 tablet, Rfl: 1 .  rosuvastatin (CRESTOR) 10 MG tablet, Take 1 tablet (10 mg total) by mouth daily., Disp: 90 tablet, Rfl: 1 .  Biotin 1000 MCG tablet, Take 1 tablet by mouth daily., Disp: , Rfl:  .  butalbital-acetaminophen-caffeine (FIORICET) 50-325-40 MG tablet, Take 1-2 tablets by mouth every 6 (six) hours as needed for headache. (Patient not taking: Reported on 02/27/2019), Disp: 30 tablet, Rfl: 0  Allergies  Allergen Reactions  . Prednisone Other (See Comments)    Elevated blood pressure, glucose spiked, mood swings  . Codeine Sulfate Other (See Comments)  . Latex Other (See Comments)    I personally reviewed active problem list, medication list, allergies, family history, social history, health maintenance with the patient/caregiver  today.   ROS  Constitutional: Negative for fever or weight change.  Respiratory: Negative for cough and shortness of breath.   Cardiovascular: Negative for chest pain or palpitations.  Gastrointestinal: Negative for abdominal pain, no bowel changes.  Musculoskeletal: Negative for gait problem or joint swelling.  Skin: Negative for rash.  Neurological: Negative for dizziness or headache.  No other specific complaints in a complete review of systems (except as listed in HPI above).  Objective  Vitals:   02/27/19 1056  BP: 104/70  Pulse: 85  Resp: 16  Temp: (!) 96.9 F (36.1 C)  TempSrc: Temporal  SpO2: 96%  Weight: 150 lb 3.2 oz (68.1 kg)  Height: 5\' 1"  (1.549 m)    Body mass index is 28.38 kg/m.  Physical Exam  Constitutional: Patient appears well-developed and well-nourished. Overweight.  No distress.  HEENT: head atraumatic, normocephalic, pupils equal and reactive to light Cardiovascular: Normal rate, regular rhythm and normal heart  sounds.  No murmur heard. No BLE edema. Pulmonary/Chest: Effort normal and breath sounds normal. No respiratory distress. Abdominal: Soft.  There is no tenderness. Psychiatric: Patient has a normal mood and affect. behavior is normal. Judgment and thought content normal.  Recent Results (from the past 2160 hour(s))  Microalbumin, urine     Status: None   Collection Time: 11/29/18  2:36 PM  Result Value Ref Range   Microalb, Ur 21.5   CBC and differential     Status: None   Collection Time: 01/22/19 12:00 AM  Result Value Ref Range   Hemoglobin 13.3 12.0 - 16.0   HCT 39 36 - 46   Platelets 277 150 - 399   WBC 6.6   CBC     Status: None   Collection Time: 01/22/19 12:00 AM  Result Value Ref Range   RBC 4.32 3.87 - [id number]  Basic metabolic panel     Status: None   Collection Time: 01/22/19 12:00 AM  Result Value Ref Range   Creatinine 1.0 0.5 - 1.1  Hepatic function panel     Status: None   Collection Time: 01/22/19 12:00 AM   Result Value Ref Range   ALT 25 7 - 35   AST 22 13 - 35  TSH     Status: None   Collection Time: 01/22/19 12:00 AM  Result Value Ref Range   TSH 2.43 0.41 - 5.90  POCT HgB A1C     Status: Normal   Collection Time: 02/27/19 11:20 AM  Result Value Ref Range   Hemoglobin A1C     HbA1c POC (<> result, manual entry)     HbA1c, POC (prediabetic range)     HbA1c, POC (controlled diabetic range) 5.7 0.0 - 7.0 %    Diabetic Foot Exam: Diabetic Foot Exam - Simple   Simple Foot Form Diabetic Foot exam was performed with the following findings: Yes 02/27/2019 11:24 AM  Visual Inspection No deformities, no ulcerations, no other skin breakdown bilaterally: Yes Sensation Testing Intact to touch and monofilament testing bilaterally: Yes Pulse Check Comments     PHQ2/9: Depression screen [facility] 2/9 02/27/2019 12/11/2018 09/12/2018 06/11/2018 02/09/2018  Decreased Interest 0 0 0 0 0  Down, Depressed, Hopeless 0 0 0 0 0  PHQ - 2 Score 0 0 0 0 0  Altered sleeping 0 0 0 1 -  Tired, decreased energy 0 0 0 0 -  Change in appetite 0 0 0 0 -  Feeling bad or failure about yourself  0 0 0 0 -  Trouble concentrating 0 0 0 0 -  Moving slowly or fidgety/restless 0 0 0 0 -  Suicidal thoughts 0 0 0 0 -  PHQ-9 Score 0 0 0 1 -  Difficult doing work/chores - - - Not difficult at all -    phq 9 is negative   Fall Risk: Fall Risk  02/27/2019 09/12/2018 06/11/2018 02/09/2018 10/11/2017  Falls in the past year? 0 0 0 0 No  Number falls in past yr: 0 0 0 - -  Injury with Fall? 0 0 0 - -    Functional Status Survey: Is the patient deaf or have difficulty hearing?: No Does the patient have difficulty seeing, even when wearing glasses/contacts?: No Does the patient have difficulty concentrating, remembering, or making decisions?: No Does the patient have difficulty walking or climbing stairs?: No Does the patient have difficulty dressing or bathing?: No Does the patient have difficulty doing errands alone such as  visiting a doctor's office or shopping?: No    Assessment & Plan  1. Controlled type 2 diabetes mellitus with microalbuminuria, without long-term current use of insulin ([facility])  - POCT HgB A1C  2. Acquired hypothyroidism  - levothyroxine (SYNTHROID) 25 MCG tablet; TAKE 1-2 TABLETS BY MOUTH DAILY BEFORE BREAKFAST. ALTERNATE ONE PILL AND TWO PILLS EVERY OTHER DAY  Dispense: 135 tablet; Refill: 1  3. Barrett's esophagus without dysplasia  - pantoprazole (PROTONIX) 40 MG tablet; Take 1 tablet (40 mg total) by mouth daily.  Dispense: 90 tablet; Refill: 1  4. Overweight (BMI 25.0-29.9)   5. Dyslipidemia  - rosuvastatin (CRESTOR) 10 MG tablet; Take 1 tablet (10 mg total) by mouth daily.  Dispense: 90 tablet; Refill: 1  6. Seronegative rheumatoid arthritis ([place])   7. Dyslipidemia associated with type 2 diabetes mellitus ([place])   8. Headache, unspecified headache type  - butalbital-acetaminophen-caffeine (FIORICET) 50-325-40 MG tablet; Take 1-2 tablets by mouth every 6 (six) hours as needed for headache.  Dispense: 30 tablet; Refill: 0  9. Vitamin D deficiency  - Cholecalciferol (VITAMIN D) 50 MCG (2000 UT) CAPS; Take 1 capsule (2,000 Units total) by mouth daily.  Dispense: 30 capsule; Refill: 12  10. Perennial allergic rhinitis  - montelukast (SINGULAIR) 10 MG tablet; Take 1 tablet (10 mg total) by mouth at bedtime.  Dispense: 90 tablet; Refill: 1 - loratadine (CLARITIN) 10 MG tablet; Take 1 tablet (10 mg total) by mouth daily.  Dispense: 90 tablet; Refill: 1 - fluticasone (FLONASE) 50 MCG/ACT nasal spray; Place 2 sprays into both nostrils daily.  Dispense: 16 g; Refill: 2 - azelastine (ASTELIN) 0.1 % nasal spray; Place 2 sprays into both nostrils daily. Use in each nostril as directed  Dispense: 30 mL; Refill: 2

## 2019-03-04 DIAGNOSIS — Z8719 Personal history of other diseases of the digestive system: Secondary | ICD-10-CM | POA: Diagnosis not present

## 2019-03-04 DIAGNOSIS — Z23 Encounter for immunization: Secondary | ICD-10-CM | POA: Diagnosis not present

## 2019-03-04 DIAGNOSIS — Z86010 Personal history of colonic polyps: Secondary | ICD-10-CM | POA: Diagnosis not present

## 2019-03-04 DIAGNOSIS — Z01812 Encounter for preprocedural laboratory examination: Secondary | ICD-10-CM | POA: Diagnosis not present

## 2019-03-21 DIAGNOSIS — Z01812 Encounter for preprocedural laboratory examination: Secondary | ICD-10-CM | POA: Diagnosis not present

## 2019-03-26 DIAGNOSIS — Z86010 Personal history of colonic polyps: Secondary | ICD-10-CM | POA: Diagnosis not present

## 2019-03-26 DIAGNOSIS — K219 Gastro-esophageal reflux disease without esophagitis: Secondary | ICD-10-CM | POA: Diagnosis not present

## 2019-03-26 DIAGNOSIS — K449 Diaphragmatic hernia without obstruction or gangrene: Secondary | ICD-10-CM | POA: Diagnosis not present

## 2019-03-26 DIAGNOSIS — K640 First degree hemorrhoids: Secondary | ICD-10-CM | POA: Diagnosis not present

## 2019-03-26 DIAGNOSIS — E119 Type 2 diabetes mellitus without complications: Secondary | ICD-10-CM | POA: Diagnosis not present

## 2019-03-26 DIAGNOSIS — K2270 Barrett's esophagus without dysplasia: Secondary | ICD-10-CM | POA: Diagnosis not present

## 2019-03-26 LAB — HM COLONOSCOPY

## 2019-04-02 DIAGNOSIS — R768 Other specified abnormal immunological findings in serum: Secondary | ICD-10-CM | POA: Diagnosis not present

## 2019-04-02 DIAGNOSIS — M0600 Rheumatoid arthritis without rheumatoid factor, unspecified site: Secondary | ICD-10-CM | POA: Diagnosis not present

## 2019-04-02 DIAGNOSIS — Z79899 Other long term (current) drug therapy: Secondary | ICD-10-CM | POA: Diagnosis not present

## 2019-04-02 DIAGNOSIS — Z0189 Encounter for other specified special examinations: Secondary | ICD-10-CM | POA: Diagnosis not present

## 2019-04-02 LAB — HEPATIC FUNCTION PANEL
ALT: 22 (ref 7–35)
AST: 20 (ref 13–35)

## 2019-04-02 LAB — CBC AND DIFFERENTIAL
HCT: 39 (ref 36–46)
Hemoglobin: 13 (ref 12.0–16.0)
Platelets: 264 (ref 150–399)
WBC: 7.6

## 2019-04-02 LAB — BASIC METABOLIC PANEL: Creatinine: 0.9 (ref 0.5–1.1)

## 2019-04-02 LAB — CBC: RBC: 4.28 (ref 3.87–5.11)

## 2019-04-05 ENCOUNTER — Encounter: Payer: Self-pay | Admitting: Family Medicine

## 2019-04-15 ENCOUNTER — Encounter: Payer: Self-pay | Admitting: Family Medicine

## 2019-05-23 ENCOUNTER — Other Ambulatory Visit: Payer: Self-pay | Admitting: Family Medicine

## 2019-05-23 DIAGNOSIS — J3089 Other allergic rhinitis: Secondary | ICD-10-CM

## 2019-07-02 DIAGNOSIS — Z0189 Encounter for other specified special examinations: Secondary | ICD-10-CM | POA: Diagnosis not present

## 2019-07-26 DIAGNOSIS — J069 Acute upper respiratory infection, unspecified: Secondary | ICD-10-CM | POA: Diagnosis not present

## 2019-07-31 ENCOUNTER — Other Ambulatory Visit: Payer: Self-pay | Admitting: Family Medicine

## 2019-07-31 DIAGNOSIS — Z1231 Encounter for screening mammogram for malignant neoplasm of breast: Secondary | ICD-10-CM

## 2019-08-01 DIAGNOSIS — Z0189 Encounter for other specified special examinations: Secondary | ICD-10-CM | POA: Diagnosis not present

## 2019-08-14 LAB — HM DIABETES EYE EXAM

## 2019-08-20 LAB — HM DIABETES EYE EXAM

## 2019-08-27 ENCOUNTER — Ambulatory Visit: Payer: BC Managed Care – PPO | Admitting: Family Medicine

## 2019-08-27 ENCOUNTER — Encounter: Payer: Self-pay | Admitting: Family Medicine

## 2019-08-27 ENCOUNTER — Other Ambulatory Visit: Payer: Self-pay

## 2019-08-27 VITALS — BP 112/78 | HR 89 | Temp 98.3°F | Resp 16 | Ht 60.0 in | Wt 160.4 lb

## 2019-08-27 DIAGNOSIS — E785 Hyperlipidemia, unspecified: Secondary | ICD-10-CM

## 2019-08-27 DIAGNOSIS — K449 Diaphragmatic hernia without obstruction or gangrene: Secondary | ICD-10-CM

## 2019-08-27 DIAGNOSIS — K2270 Barrett's esophagus without dysplasia: Secondary | ICD-10-CM

## 2019-08-27 DIAGNOSIS — M0600 Rheumatoid arthritis without rheumatoid factor, unspecified site: Secondary | ICD-10-CM

## 2019-08-27 DIAGNOSIS — R519 Headache, unspecified: Secondary | ICD-10-CM

## 2019-08-27 DIAGNOSIS — E559 Vitamin D deficiency, unspecified: Secondary | ICD-10-CM

## 2019-08-27 DIAGNOSIS — K219 Gastro-esophageal reflux disease without esophagitis: Secondary | ICD-10-CM | POA: Diagnosis not present

## 2019-08-27 DIAGNOSIS — E039 Hypothyroidism, unspecified: Secondary | ICD-10-CM

## 2019-08-27 DIAGNOSIS — R809 Proteinuria, unspecified: Secondary | ICD-10-CM

## 2019-08-27 DIAGNOSIS — E1169 Type 2 diabetes mellitus with other specified complication: Secondary | ICD-10-CM

## 2019-08-27 DIAGNOSIS — L659 Nonscarring hair loss, unspecified: Secondary | ICD-10-CM

## 2019-08-27 DIAGNOSIS — E1129 Type 2 diabetes mellitus with other diabetic kidney complication: Secondary | ICD-10-CM

## 2019-08-27 DIAGNOSIS — Z79899 Other long term (current) drug therapy: Secondary | ICD-10-CM

## 2019-08-27 DIAGNOSIS — J3089 Other allergic rhinitis: Secondary | ICD-10-CM

## 2019-08-27 LAB — POCT GLYCOSYLATED HEMOGLOBIN (HGB A1C): Hemoglobin A1C: 6.1 % — AB (ref 4.0–5.6)

## 2019-08-27 MED ORDER — LEVOTHYROXINE SODIUM 25 MCG PO TABS: ORAL_TABLET | ORAL | 1 refills | Status: DC

## 2019-08-27 MED ORDER — ROSUVASTATIN CALCIUM 10 MG PO TABS: 10.0000 mg | ORAL_TABLET | Freq: Every day | ORAL | 1 refills | Status: DC

## 2019-08-27 MED ORDER — BUTALBITAL-APAP-CAFFEINE 50-325-40 MG PO TABS: 1.0000 | ORAL_TABLET | Freq: Four times a day (QID) | ORAL | 0 refills | Status: DC | PRN

## 2019-08-27 MED ORDER — MONTELUKAST SODIUM 10 MG PO TABS: 10.0000 mg | ORAL_TABLET | Freq: Every day | ORAL | 1 refills | Status: DC

## 2019-08-27 MED ORDER — PANTOPRAZOLE SODIUM 40 MG PO TBEC: 40.0000 mg | DELAYED_RELEASE_TABLET | Freq: Every day | ORAL | 1 refills | Status: DC

## 2019-08-27 NOTE — Progress Notes (Signed)
Name: Colleen Curry   MRN: [id number]    DOB: 1955-06-20   Date:08/27/2019       Progress Note  Subjective  Chief Complaint  Chief Complaint  Patient presents with  . Follow-up  . Diabetes    HPI  DM II: she had a long history of metabolic syndrome, [id number] showedfasting glucose was 123 and hgbA1C 7.2% , she was havingepisodes of moodiness, polyphagia, polydipsia and polyuria, that started June 2018 after placed on prednisone by Dr. Sabra Heck ( ortho)She has family history of DM ( 3 aunts, 2 uncles, brother, sister, daughter and maternal grandmother).Her A1C spike in July at 7.4% in July 2020, she states she was not following a diabetic diet at the time, she resumed a diabetic diet and A1C was 6.9 % in September 2020  . She states since last visit not eating pasta, white bread and drinking diet sodas and A1C is down to 5.7 %. She was never on diabetic medication. On crestor for cholesterol.   Overweight: Shestates weight has been stable.She has changed her eating habits since she went back to work May 2020Her weight is good, BMI is below 30 now.Continue life style modification   Alopecia: she has a long history of hair loss,she was seen by Dermatologist but released [id number].She was given clobetasol and also hadsteroids injections but did not improve much, she wears a wig   RA: seeing Dr. Meda Coffee, diagnosed in Feb2018, but started on Methotrexate March 2018 and Plaquenil was added  Fall 2020. She states plaquenil seems to drop her sugarinitiallya little she states initially she has a lot of pruritis with plaquenil but is doing better. She states only joints that still bothers her are her fingers and hand stays swollen, she is now on Rinvoq daily , just started, not sure if working yet   Headaches: she states she has recurrent headaches, started in her 30's, described as gradual onset, throbbing like, it can be temporal, frontal or nuchal area, when intense she  has nausea and sound sensitivity, denies aura, brother has a history of migraine. No recent episodes. Taking Fioricet prn , she needs a refill today   Hypothyroidism: taking 25 mcg dailyalternating with 50 mcg every other day, last TSH was at goal at 2.43, recheck today    Barretts Esophagus: controlled with pantoprazole,she denies heartburn or dysphagia.Controlled, had recent EGD reviewed with patient   Dyslipidemia: LDLwas down to 43, HDL was good at 47 , Currently on Crestor and aspirin and no side effects. We will recheck labs    Patient Active Problem List   Diagnosis Date Noted  . Trigger finger 08/30/2018  . Controlled type 2 diabetes mellitus with microalbuminuria, without long-term current use of insulin ([place]) 02/09/2018  . Prediabetes 06/05/2017  . Seronegative rheumatoid arthritis ([place]) 06/30/2016  . Cyclic citrullinated peptide (CCP) antibody positive 03/14/2016  . Chondromalacia of both patellae 03/14/2016  . Acquired trigger finger 03/14/2016  . History of hysterectomy 08/08/2014  . Menopause 08/08/2014  . History of iron deficiency anemia 08/08/2014  . Perennial allergic rhinitis 08/07/2014  . Barrett esophagus 08/07/2014  . Dyslipidemia 08/07/2014  . Family history of diabetes mellitus 08/07/2014  . Esophagitis, reflux 08/07/2014  . Acquired hypothyroidism 08/07/2014  . Dysmetabolic syndrome 26/33/3545  . Obesity (BMI 30-39.9) 08/07/2014  . Vitamin D deficiency 08/07/2014  . Tubular adenoma of colon 08/07/2014    Past Surgical History:  Procedure Laterality Date  . ABDOMINAL HYSTERECTOMY      Family History  Problem Relation  Age of Onset  . Glaucoma Brother   . Stroke Paternal Grandfather   . Thyroid disease Father   . Heart disease Father   . Seizures Father   . Atrial fibrillation Father   . Diabetes Sister   . Hypertension Sister   . Breast cancer Neg Hx     Social History   Tobacco Use  . Smoking status: Never Smoker  . Smokeless  tobacco: Never Used  Substance Use Topics  . Alcohol use: No    Alcohol/week: 0.0 standard drinks     Current Outpatient Medications:  .  aspirin (ASPIRIN ADULT LOW STRENGTH) 81 MG chewable tablet, Chew by mouth., Disp: , Rfl:  .  azelastine (ASTELIN) 0.1 % nasal spray, PLACE 2 SPRAYS INTO BOTH NOSTRILS DAILY. USE IN EACH NOSTRIL AS DIRECTED, Disp: 30 mL, Rfl: 2 .  Biotin 1000 MCG tablet, Take 1 tablet by mouth daily., Disp: , Rfl:  .  butalbital-acetaminophen-caffeine (FIORICET) 50-325-40 MG tablet, Take 1-2 tablets by mouth every 6 (six) hours as needed for headache., Disp: 30 tablet, Rfl: 0 .  Cholecalciferol (VITAMIN D) 50 MCG (2000 UT) CAPS, Take 1 capsule (2,000 Units total) by mouth daily., Disp: 30 capsule, Rfl: 12 .  fluticasone (FLONASE) 50 MCG/ACT nasal spray, SPRAY 2 SPRAYS INTO EACH NOSTRIL EVERY DAY, Disp: 48 mL, Rfl: 2 .  folic acid (FOLVITE) 1 MG tablet, Take 1 mg by mouth daily., Disp: , Rfl: 4 .  hydroxychloroquine (PLAQUENIL) 200 MG tablet, One tab twice a day for rheumatoid arthritis, 30 days, 60 tabs, Disp: , Rfl:  .  levothyroxine (SYNTHROID) 25 MCG tablet, TAKE 1-2 TABLETS BY MOUTH DAILY BEFORE BREAKFAST. ALTERNATE ONE PILL AND TWO PILLS EVERY OTHER DAY, Disp: 135 tablet, Rfl: 1 .  loratadine (CLARITIN) 10 MG tablet, Take 1 tablet (10 mg total) by mouth daily., Disp: 90 tablet, Rfl: 1 .  methotrexate (RHEUMATREX) 2.5 MG tablet, Take 2.5 mg by mouth once a week. 7 tablets at once a week-15 mg, Disp: , Rfl:  .  montelukast (SINGULAIR) 10 MG tablet, Take 1 tablet (10 mg total) by mouth at bedtime., Disp: 90 tablet, Rfl: 1 .  pantoprazole (PROTONIX) 40 MG tablet, Take 1 tablet (40 mg total) by mouth daily., Disp: 90 tablet, Rfl: 1 .  RINVOQ 15 MG TB24, Take 1 tablet by mouth daily., Disp: , Rfl:  .  rosuvastatin (CRESTOR) 10 MG tablet, Take 1 tablet (10 mg total) by mouth daily., Disp: 90 tablet, Rfl: 1  Allergies  Allergen Reactions  . Prednisone Other (See Comments)     Elevated blood pressure, glucose spiked, mood swings  . Codeine Sulfate Other (See Comments)  . Latex Other (See Comments)    I personally reviewed active problem list, medication list, allergies, family history, social history, health maintenance with the patient/caregiver today.   ROS  Constitutional: Negative for fever or weight change.  Respiratory: Negative for cough and shortness of breath.   Cardiovascular: Negative for chest pain or palpitations.  Gastrointestinal: Negative for abdominal pain, no bowel changes.  Musculoskeletal: Negative for gait problem or joint swelling.  Skin: Negative for rash.  Neurological: Negative for dizziness or headache.  No other specific complaints in a complete review of systems (except as listed in HPI above).  Objective  Vitals:   08/27/19 1003  BP: 112/78  Pulse: 89  Resp: 16  Temp: 98.3 F (36.8 C)  TempSrc: Temporal  SpO2: 99%  Weight: 160 lb 6.4 oz (72.8 kg)  Height: 5' (  1.524 m)    Body mass index is 31.33 kg/m.  Physical Exam  Constitutional: Patient appears well-developed and well-nourished. Obese  No distress.  HEENT: head atraumatic, normocephalic, pupils equal and reactive to light, neck supple Cardiovascular: Normal rate, regular rhythm and normal heart sounds.  No murmur heard. No BLE edema. Pulmonary/Chest: Effort normal and breath sounds normal. No respiratory distress. Abdominal: Soft.  There is no tenderness. Psychiatric: Patient has a normal mood and affect. behavior is normal. Judgment and thought content normal. Muscular skeletal: left thumb has a brace, some synovitis   PHQ2/9: Depression screen [facility] 2/9 08/27/2019 02/27/2019 12/11/2018 09/12/2018 06/11/2018  Decreased Interest 0 0 0 0 0  Down, Depressed, Hopeless 0 0 0 0 0  PHQ - 2 Score 0 0 0 0 0  Altered sleeping 1 0 0 0 1  Tired, decreased energy 1 0 0 0 0  Change in appetite 0 0 0 0 0  Feeling bad or failure about yourself  0 0 0 0 0  Trouble  concentrating 0 0 0 0 0  Moving slowly or fidgety/restless 0 0 0 0 0  Suicidal thoughts 0 0 0 0 0  PHQ-9 Score 2 0 0 0 1  Difficult doing work/chores Not difficult at all - - - Not difficult at all  Some recent data might be hidden    phq 9 is negative   Fall Risk: Fall Risk  08/27/2019 02/27/2019 09/12/2018 06/11/2018 02/09/2018  Falls in the past year? 0 0 0 0 0  Number falls in past yr: 0 0 0 0 -  Injury with Fall? 1 0 0 0 -      Assessment & Plan   1. Barrett's esophagus without dysplasia  - pantoprazole (PROTONIX) 40 MG tablet; Take 1 tablet (40 mg total) by mouth daily.  Dispense: 90 tablet; Refill: 1  2. Gastroesophageal reflux disease without esophagitis  Reviewed report, EGD results   3. Hiatal hernia without gangrene and obstruction   4. Dyslipidemia associated with type 2 diabetes mellitus (HCC)  - Lipid panel - POCT glycosylated hemoglobin (Hb A1C)  5. Controlled type 2 diabetes mellitus with microalbuminuria, without long-term current use of insulin ([place])   6. Acquired hypothyroidism  - TSH - levothyroxine (SYNTHROID) 25 MCG tablet; TAKE 1-2 TABLETS BY MOUTH DAILY BEFORE BREAKFAST. ALTERNATE ONE PILL AND TWO PILLS EVERY OTHER DAY  Dispense: 135 tablet; Refill: 1  7. Dyslipidemia  - rosuvastatin (CRESTOR) 10 MG tablet; Take 1 tablet (10 mg total) by mouth daily.  Dispense: 90 tablet; Refill: 1  8. Seronegative rheumatoid arthritis ([place])  Planning on retiring this week because of increase in pain during work   9. Vitamin D deficiency  - VITAMIN D 25 Hydroxy (Vit-D Deficiency, Fractures)  10. Alopecia   11. Long-term use of high-risk medication  - CBC with Differential/Platelet - COMPLETE METABOLIC PANEL WITH GFR  12. Perennial allergic rhinitis  - montelukast (SINGULAIR) 10 MG tablet; Take 1 tablet (10 mg total) by mouth at bedtime.  Dispense: 90 tablet; Refill: 1  13. Headache, unspecified headache type  - butalbital-acetaminophen-caffeine  (FIORICET) 50-325-40 MG tablet; Take 1-2 tablets by mouth every 6 (six) hours as needed for headache.  Dispense: 30 tablet; Refill: 0

## 2019-08-30 ENCOUNTER — Ambulatory Visit
Admission: RE | Admit: 2019-08-30 | Discharge: 2019-08-30 | Disposition: A | Discharge status: Home / Self Care | Payer: BC Managed Care – PPO | Source: Ambulatory Visit | Attending: Family Medicine | Admitting: Family Medicine

## 2019-08-30 ENCOUNTER — Other Ambulatory Visit: Payer: Self-pay

## 2019-08-30 DIAGNOSIS — Z1231 Encounter for screening mammogram for malignant neoplasm of breast: Secondary | ICD-10-CM | POA: Diagnosis not present

## 2019-09-23 ENCOUNTER — Encounter: Payer: Self-pay | Admitting: Family Medicine

## 2019-11-12 ENCOUNTER — Ambulatory Visit (INDEPENDENT_AMBULATORY_CARE_PROVIDER_SITE_OTHER): Payer: 59

## 2019-11-12 ENCOUNTER — Other Ambulatory Visit: Payer: Self-pay

## 2019-11-12 DIAGNOSIS — Z23 Encounter for immunization: Secondary | ICD-10-CM

## 2019-12-12 ENCOUNTER — Ambulatory Visit: Payer: Self-pay

## 2019-12-12 NOTE — Telephone Encounter (Signed)
Pt states she just started taking RINVOQ 15 MG TB24. She came down with a cold this week and wants to know if she should be doing anything different. Doesn't know what she can take over the counter Pt. Reports she started having runny nose, tickle in throat yesterday. No fever or cough."I have a lot of drainage in my throat." Yellow mucus when she blows her nose. Has started Rinvoq 1 week ago and wants to know what else she can take for her symptoms. Takes Claritin and singulair and using nasal spray. Please advise pt. Answer Assessment - Initial Assessment Questions 1. ONSET: "When did the nasal discharge start?"      Yesterday 2. AMOUNT: "How much discharge is there?"      Mild 3. COUGH: "Do you have a cough?" If yes, ask: "Describe the color of your sputum" (clear, white, yellow, green)     No 4. RESPIRATORY DISTRESS: "Describe your breathing."      No 5. FEVER: "Do you have a fever?" If Yes, ask: "What is your temperature, how was it measured, and when did it start?"     No 6. SEVERITY: "Overall, how bad are you feeling right now?" (e.g., doesn't interfere with normal activities, staying home from school/work, staying in bed)      Mild 7. OTHER SYMPTOMS: "Do you have any other symptoms?" (e.g., sore throat, earache, wheezing, vomiting)     Drainage in throat 8. PREGNANCY: "Is there any chance you are pregnant?" "When was your last menstrual period?"     No  Protocols used: COMMON COLD-A-AH

## 2019-12-13 NOTE — Telephone Encounter (Signed)
Spoke with patient and she agreed to go get tested. She will contact us with results and if we can provide any assistance going forward.

## 2020-02-05 ENCOUNTER — Other Ambulatory Visit: Payer: Self-pay | Admitting: Family Medicine

## 2020-02-05 DIAGNOSIS — J3089 Other allergic rhinitis: Secondary | ICD-10-CM

## 2020-02-26 ENCOUNTER — Other Ambulatory Visit: Payer: Self-pay | Admitting: Family Medicine

## 2020-02-26 DIAGNOSIS — E039 Hypothyroidism, unspecified: Secondary | ICD-10-CM

## 2020-02-26 NOTE — Telephone Encounter (Signed)
   Notes to clinic: Patient has office visit on 02/27/2020 Due for labs  Requested Prescriptions  Pending Prescriptions Disp Refills   levothyroxine (SYNTHROID) 25 MCG tablet [Pharmacy Med Name: LEVOTHYROXINE 25 MCG TABLET] 135 tablet 1    Sig: TAKE 1-2 TABLETS BY MOUTH DAILY BEFORE BREAKFAST. ALTERNATE ONE PILL AND TWO PILLS EVERY OTHER DAY      Endocrinology:  Hypothyroid Agents Failed - 02/26/2020  1:41 AM      Failed - TSH needs to be rechecked within 3 months after an abnormal result. Refill until TSH is due.      Failed - TSH in normal range and within 360 days    TSH  Date Value Ref Range Status  01/22/2019 2.43 0.41 - 5.90 Final          Passed - Valid encounter within last 12 months    Recent Outpatient Visits           6 months ago Barrett's esophagus without dysplasia   [facility], Drue Stager, MD   12 months ago Controlled type 2 diabetes mellitus with microalbuminuria, without long-term current use of insulin [facility])   [facility], Drue Stager, MD   1 year ago Dyslipidemia associated with type 2 diabetes mellitus [facility])   [facility] Steele Sizer, MD   1 year ago Diabetes mellitus type 2 in obese [facility])   [facility] Steele Sizer, MD   1 year ago Seronegative rheumatoid arthritis [facility])   [facility] Steele Sizer, MD       Future Appointments             Tomorrow Steele Sizer, MD [facility], [facility]

## 2020-02-26 NOTE — Progress Notes (Signed)
Name: Colleen Curry   MRN: [id number]    DOB: 1955-02-20   Date:02/27/2020       Progress Note  Subjective  Chief Complaint  Follow Up  HPI  DM II: she had a long history of metabolic syndrome, [id number] showedfasting glucose was 123 and hgbA1C 7.2% , she was havingepisodes of moodiness, polyphagia, polydipsia and polyuria, that started June 2018 after placed on prednisone by Dr. Sabra Heck ( ortho)She has family history of DM ( 3 aunts, 2 uncles, brother, sister, daughter and maternal grandmother).Her A1C spike in July at 7.4% in July 2020, she states she was not following a diabetic diet at the time, she resumed a diabetic diet and A1C was 6.9 % in September 2020  . She states not following a diabetic diet, but has been a little more active watching her two grandchildren - she is watching them daily   Obesity : she had changed her diet and was in the overweight range however gradually gaining it back, BMI above 30 again. Discussed importance of resuming a healthier diet   Alopecia: she has a long history of hair loss,she was seen by Dermatologist but released since2019.She was given clobetasol and also hadsteroids injections but did not improve much, she wears a wig . Unchanged   RA:  diagnosed in Feb2018 by Dr. Meda Coffee, she started on  Methotrexate March 2018 and Plaquenil was added  Fall 2020. She states also on Rinvoq, she noticed constipation since she started medication. Advised to add miralax half pack daily and see if improves symptoms without side effects. She retired in 2021, states swelling and stiffness is down, doing much better now   Headaches: she states she has recurrent headaches, started in her 30's, described as gradual onset, throbbing like, it can be temporal, frontal or nuchal area, when intense she has nausea and sound sensitivity, denies aura, brother has a history of migraine. Taking Fioricet prn . She states episodes down to about once a month    Hypothyroidism: taking 25 mcg dailyalternating with 50 mcg every other day, last TSH was at goal at 2.43, she will go to the lab today   History of Barretts Esophagus: controlled with pantoprazole,she denies heartburn or dysphagia.She had positive back in 2008, three negative EGD 's since last one done by Dr. Esmond Harps 03/26/2019   Dyslipidemia: LDLwas down to 43, HDL was good at 47 , Currently on Crestor and aspirin and no side effects. She will get labs done today     Patient Active Problem List   Diagnosis Date Noted  . Trigger finger 08/30/2018  . Controlled type 2 diabetes mellitus with microalbuminuria, without long-term current use of insulin ([place]) 02/09/2018  . Prediabetes 06/05/2017  . Seronegative rheumatoid arthritis ([place]) 06/30/2016  . Cyclic citrullinated peptide (CCP) antibody positive 03/14/2016  . Chondromalacia of both patellae 03/14/2016  . Acquired trigger finger 03/14/2016  . History of hysterectomy 08/08/2014  . Menopause 08/08/2014  . History of iron deficiency anemia 08/08/2014  . Perennial allergic rhinitis 08/07/2014  . Barrett esophagus 08/07/2014  . Dyslipidemia 08/07/2014  . Family history of diabetes mellitus 08/07/2014  . Esophagitis, reflux 08/07/2014  . Acquired hypothyroidism 08/07/2014  . Dysmetabolic syndrome 60/63/0160  . Obesity (BMI 30-39.9) 08/07/2014  . Vitamin D deficiency 08/07/2014  . Tubular adenoma of colon 08/07/2014    Past Surgical History:  Procedure Laterality Date  . ABDOMINAL HYSTERECTOMY      Family History  Problem Relation Age of Onset  . Glaucoma Brother   .  Stroke Paternal Grandfather   . Thyroid disease Father   . Heart disease Father   . Seizures Father   . Atrial fibrillation Father   . Diabetes Sister   . Hypertension Sister   . Breast cancer Neg Hx     Social History   Tobacco Use  . Smoking status: Never Smoker  . Smokeless tobacco: Never Used  Substance Use Topics  . Alcohol use: No     Alcohol/week: 0.0 standard drinks     Current Outpatient Medications:  .  aspirin 81 MG chewable tablet, Chew by mouth., Disp: , Rfl:  .  azelastine (ASTELIN) 0.1 % nasal spray, PLACE 2 SPRAYS INTO BOTH NOSTRILS DAILY. USE IN EACH NOSTRIL AS DIRECTED, Disp: 30 mL, Rfl: 2 .  Biotin 1000 MCG tablet, Take 1 tablet by mouth daily., Disp: , Rfl:  .  butalbital-acetaminophen-caffeine (FIORICET) 50-325-40 MG tablet, Take 1-2 tablets by mouth every 6 (six) hours as needed for headache., Disp: 30 tablet, Rfl: 0 .  Cholecalciferol (VITAMIN D) 50 MCG (2000 UT) CAPS, Take 1 capsule (2,000 Units total) by mouth daily., Disp: 30 capsule, Rfl: 12 .  fluticasone (FLONASE) 50 MCG/ACT nasal spray, SPRAY 2 SPRAYS INTO EACH NOSTRIL EVERY DAY, Disp: 48 mL, Rfl: 2 .  folic acid (FOLVITE) 1 MG tablet, Take 1 mg by mouth daily., Disp: , Rfl: 4 .  hydroxychloroquine (PLAQUENIL) 200 MG tablet, One tab twice a day for rheumatoid arthritis, 30 days, 60 tabs, Disp: , Rfl:  .  levothyroxine (SYNTHROID) 25 MCG tablet, TAKE 1-2 TABLETS BY MOUTH DAILY BEFORE BREAKFAST. ALTERNATE ONE PILL AND TWO PILLS EVERY OTHER DAY, Disp: 135 tablet, Rfl: 1 .  loratadine (CLARITIN) 10 MG tablet, TAKE 1 TABLET BY MOUTH EVERY DAY, Disp: 90 tablet, Rfl: 1 .  methotrexate (RHEUMATREX) 2.5 MG tablet, Take 2.5 mg by mouth once a week. 7 tablets at once a week-15 mg, Disp: , Rfl:  .  montelukast (SINGULAIR) 10 MG tablet, Take 1 tablet (10 mg total) by mouth at bedtime., Disp: 90 tablet, Rfl: 1 .  pantoprazole (PROTONIX) 40 MG tablet, Take 1 tablet (40 mg total) by mouth daily., Disp: 90 tablet, Rfl: 1 .  RINVOQ 15 MG TB24, Take 1 tablet by mouth daily., Disp: , Rfl:  .  rosuvastatin (CRESTOR) 10 MG tablet, Take 1 tablet (10 mg total) by mouth daily., Disp: 90 tablet, Rfl: 1  Allergies  Allergen Reactions  . Prednisone Other (See Comments)    Elevated blood pressure, glucose spiked, mood swings  . Codeine Sulfate Other (See Comments)  . Latex  Other (See Comments)  . Solanum Lycopersicum [Tomato] Other (See Comments)    Thinks she may be allergic to this.  She had mouth and throat irritation.    I personally reviewed active problem list, medication list, allergies, family history, social history, health maintenance with the patient/caregiver today.   ROS  Constitutional: Negative for fever or weight change.  Respiratory: Negative for cough and shortness of breath.   Cardiovascular: Negative for chest pain or palpitations.  Gastrointestinal: Negative for abdominal pain, no bowel changes.  Musculoskeletal: Negative for gait problem or joint swelling.  Skin: Negative for rash.  Neurological: Negative for dizziness or headache.  No other specific complaints in a complete review of systems (except as listed in HPI above).  Objective  Vitals:   02/27/20 0856  BP: 122/76  Pulse: 80  Resp: 16  Temp: 98.6 F (37 C)  TempSrc: Oral  Weight: 168 lb 3.2  oz (76.3 kg)  Height: 5' (1.524 m)    Body mass index is 32.85 kg/m.  Physical Exam  Constitutional: Patient appears well-developed and well-nourished. Obese  No distress.  HEENT: head atraumatic, normocephalic, pupils equal and reactive to light, neck supple Cardiovascular: Normal rate, regular rhythm and normal heart sounds.  No murmur heard. No BLE edema. Pulmonary/Chest: Effort normal and breath sounds normal. No respiratory distress. Abdominal: Soft.  There is no tenderness. Psychiatric: Patient has a normal mood and affect. behavior is normal. Judgment and thought content normal.  Recent Results (from the past 2160 hour(s))  POCT HgB A1C     Status: Abnormal   Collection Time: 02/27/20  9:01 AM  Result Value Ref Range   Hemoglobin A1C 6.0 (A) 4.0 - 5.6 %   HbA1c POC (<> result, manual entry)     HbA1c, POC (prediabetic range)     HbA1c, POC (controlled diabetic range)      Diabetic Foot Exam: Diabetic Foot Exam - Simple   Simple Foot Form Diabetic Foot  exam was performed with the following findings: Yes 02/27/2020  9:35 AM  Visual Inspection No deformities, no ulcerations, no other skin breakdown bilaterally: Yes Sensation Testing Intact to touch and monofilament testing bilaterally: Yes Pulse Check Posterior Tibialis and Dorsalis pulse intact bilaterally: Yes Comments      PHQ2/9: Depression screen [facility] 2/9 02/27/2020 08/27/2019 02/27/2019 12/11/2018 09/12/2018  Decreased Interest 0 0 0 0 0  Down, Depressed, Hopeless 0 0 0 0 0  PHQ - 2 Score 0 0 0 0 0  Altered sleeping - 1 0 0 0  Tired, decreased energy - 1 0 0 0  Change in appetite - 0 0 0 0  Feeling bad or failure about yourself  - 0 0 0 0  Trouble concentrating - 0 0 0 0  Moving slowly or fidgety/restless - 0 0 0 0  Suicidal thoughts - 0 0 0 0  PHQ-9 Score - 2 0 0 0  Difficult doing work/chores - Not difficult at all - - -  Some recent data might be hidden    phq 9 is negative   Fall Risk: Fall Risk  02/27/2020 08/27/2019 02/27/2019 09/12/2018 06/11/2018  Falls in the past year? 0 0 0 0 0  Number falls in past yr: 0 0 0 0 0  Injury with Fall? 0 1 0 0 0     Functional Status Survey: Is the patient deaf or have difficulty hearing?: No Does the patient have difficulty seeing, even when wearing glasses/contacts?: No Does the patient have difficulty concentrating, remembering, or making decisions?: No Does the patient have difficulty walking or climbing stairs?: No Does the patient have difficulty dressing or bathing?: No Does the patient have difficulty doing errands alone such as visiting a doctor's office or shopping?: No    Assessment & Plan  1. Controlled type 2 diabetes mellitus with microalbuminuria, without long-term current use of insulin (HCC)  - POCT HgB A1C - HM Diabetes Foot Exam  2. Acquired hypothyroidism   recheck levels  3. Diabetes mellitus type 2 in obese ([place])   4. Dyslipidemia associated with type 2 diabetes mellitus ([place])   5. History of  Barrett's esophagus   6. Gastroesophageal reflux disease without esophagitis   7. Seronegative rheumatoid arthritis ([place])   8. Dyslipidemia  Recheck labs   9. Vitamin D deficiency   10. Perennial allergic rhinitis

## 2020-02-27 ENCOUNTER — Other Ambulatory Visit: Payer: Self-pay

## 2020-02-27 ENCOUNTER — Encounter: Payer: Self-pay | Admitting: Family Medicine

## 2020-02-27 ENCOUNTER — Ambulatory Visit (INDEPENDENT_AMBULATORY_CARE_PROVIDER_SITE_OTHER): Payer: 59 | Admitting: Family Medicine

## 2020-02-27 VITALS — BP 122/76 | HR 80 | Temp 98.6°F | Resp 16 | Ht 60.0 in | Wt 168.2 lb

## 2020-02-27 DIAGNOSIS — E1129 Type 2 diabetes mellitus with other diabetic kidney complication: Secondary | ICD-10-CM

## 2020-02-27 DIAGNOSIS — E039 Hypothyroidism, unspecified: Secondary | ICD-10-CM

## 2020-02-27 DIAGNOSIS — Z8719 Personal history of other diseases of the digestive system: Secondary | ICD-10-CM

## 2020-02-27 DIAGNOSIS — J3089 Other allergic rhinitis: Secondary | ICD-10-CM

## 2020-02-27 DIAGNOSIS — E669 Obesity, unspecified: Secondary | ICD-10-CM

## 2020-02-27 DIAGNOSIS — E559 Vitamin D deficiency, unspecified: Secondary | ICD-10-CM

## 2020-02-27 DIAGNOSIS — E1169 Type 2 diabetes mellitus with other specified complication: Secondary | ICD-10-CM | POA: Diagnosis not present

## 2020-02-27 DIAGNOSIS — R809 Proteinuria, unspecified: Secondary | ICD-10-CM | POA: Diagnosis not present

## 2020-02-27 DIAGNOSIS — M0600 Rheumatoid arthritis without rheumatoid factor, unspecified site: Secondary | ICD-10-CM

## 2020-02-27 DIAGNOSIS — K219 Gastro-esophageal reflux disease without esophagitis: Secondary | ICD-10-CM

## 2020-02-27 DIAGNOSIS — E785 Hyperlipidemia, unspecified: Secondary | ICD-10-CM

## 2020-02-27 LAB — POCT GLYCOSYLATED HEMOGLOBIN (HGB A1C): Hemoglobin A1C: 6 % — AB (ref 4.0–5.6)

## 2020-02-28 LAB — CBC WITH DIFFERENTIAL/PLATELET
Absolute Monocytes: 470 cells/uL (ref 200–950)
Basophils Absolute: 20 cells/uL (ref 0–200)
Basophils Relative: 0.4 %
Eosinophils Absolute: 29 cells/uL (ref 15–500)
Eosinophils Relative: 0.6 %
HCT: 34.7 % — ABNORMAL LOW (ref 35.0–45.0)
Hemoglobin: 12 g/dL (ref 11.7–15.5)
Lymphs Abs: 2597 cells/uL (ref 850–3900)
MCH: 31.9 pg (ref 27.0–33.0)
MCHC: 34.6 g/dL (ref 32.0–36.0)
MCV: 92.3 fL (ref 80.0–100.0)
MPV: 9.7 fL (ref 7.5–12.5)
Monocytes Relative: 9.6 %
Neutro Abs: 1784 cells/uL (ref 1500–7800)
Neutrophils Relative %: 36.4 %
Platelets: 319 10*3/uL (ref 140–400)
RBC: 3.76 10*6/uL — ABNORMAL LOW (ref 3.80–5.10)
RDW: 12.7 % (ref 11.0–15.0)
Total Lymphocyte: 53 %
WBC: 4.9 10*3/uL (ref 3.8–10.8)

## 2020-02-28 LAB — LIPID PANEL
Cholesterol: 112 mg/dL (ref <200)
HDL: 42 mg/dL — ABNORMAL LOW (ref >=50)
LDL Cholesterol (Calc): 54 mg/dL (calc)
Non-HDL Cholesterol (Calc): 70 mg/dL (calc) (ref <130)
Total CHOL/HDL Ratio: 2.7 (calc) (ref <5.0)
Triglycerides: 78 mg/dL (ref <150)

## 2020-02-28 LAB — COMPLETE METABOLIC PANEL WITH GFR
AG Ratio: 1.7 (calc) (ref 1.0–2.5)
ALT: 51 U/L — ABNORMAL HIGH (ref 6–29)
AST: 39 U/L — ABNORMAL HIGH (ref 10–35)
Albumin: 4.4 g/dL (ref 3.6–5.1)
Alkaline phosphatase (APISO): 74 U/L (ref 37–153)
BUN: 9 mg/dL (ref 7–25)
CO2: 27 mmol/L (ref 20–32)
Calcium: 9.6 mg/dL (ref 8.6–10.4)
Chloride: 109 mmol/L (ref 98–110)
Creat: 0.92 mg/dL (ref 0.50–0.99)
GFR, Est African American: 77 mL/min/{1.73_m2} (ref >=60)
GFR, Est Non African American: 67 mL/min/{1.73_m2} (ref >=60)
Globulin: 2.6 g/dL (calc) (ref 1.9–3.7)
Glucose, Bld: 120 mg/dL — ABNORMAL HIGH (ref 65–99)
Potassium: 3.6 mmol/L (ref 3.5–5.3)
Sodium: 145 mmol/L (ref 135–146)
Total Bilirubin: 0.4 mg/dL (ref 0.2–1.2)
Total Protein: 7 g/dL (ref 6.1–8.1)

## 2020-02-28 LAB — VITAMIN D 25 HYDROXY (VIT D DEFICIENCY, FRACTURES): Vit D, 25-Hydroxy: 34 ng/mL (ref 30–100)

## 2020-02-28 LAB — TSH: TSH: 2.75 mIU/L (ref 0.40–4.50)

## 2020-03-27 ENCOUNTER — Other Ambulatory Visit: Payer: Self-pay | Admitting: Family Medicine

## 2020-03-27 DIAGNOSIS — R519 Headache, unspecified: Secondary | ICD-10-CM

## 2020-03-27 DIAGNOSIS — E785 Hyperlipidemia, unspecified: Secondary | ICD-10-CM

## 2020-04-20 ENCOUNTER — Other Ambulatory Visit: Payer: Self-pay | Admitting: Family Medicine

## 2020-04-20 DIAGNOSIS — J3089 Other allergic rhinitis: Secondary | ICD-10-CM

## 2020-04-20 DIAGNOSIS — K2270 Barrett's esophagus without dysplasia: Secondary | ICD-10-CM

## 2020-04-20 NOTE — Telephone Encounter (Signed)
Requested Prescriptions  Pending Prescriptions Disp Refills  . montelukast (SINGULAIR) 10 MG tablet [Pharmacy Med Name: MONTELUKAST SOD 10 MG TABLET] 90 tablet 3    Sig: TAKE 1 TABLET BY MOUTH EVERYDAY AT BEDTIME     Pulmonology:  Leukotriene Inhibitors Passed - 04/20/2020  7:23 PM      Passed - Valid encounter within last 12 months    Recent Outpatient Visits          1 month ago Controlled type 2 diabetes mellitus with microalbuminuria, without long-term current use of insulin [facility])   [facility] Steele Sizer, MD   7 months ago Barrett's esophagus without dysplasia   [facility] [place], Drue Stager, MD   1 year ago Controlled type 2 diabetes mellitus with microalbuminuria, without long-term current use of insulin [facility])   [facility] [place], Drue Stager, MD   1 year ago Dyslipidemia associated with type 2 diabetes mellitus [facility])   [facility] Steele Sizer, MD   1 year ago Diabetes mellitus type 2 in obese [facility])   [facility] Steele Sizer, MD      Future Appointments            In 4 months Ancil Boozer, Drue Stager, MD [facility], [place]           . pantoprazole (PROTONIX) 40 MG tablet [Pharmacy Med Name: PANTOPRAZOLE SOD DR 40 MG TAB] 90 tablet 3    Sig: TAKE 1 TABLET BY MOUTH EVERY DAY     Gastroenterology: Proton Pump Inhibitors Passed - 04/20/2020  7:23 PM      Passed - Valid encounter within last 12 months    Recent Outpatient Visits          1 month ago Controlled type 2 diabetes mellitus with microalbuminuria, without long-term current use of insulin [facility])   [facility] Steele Sizer, MD   7 months ago Barrett's esophagus without dysplasia   [facility] [place], Drue Stager, MD   1 year ago Controlled type 2 diabetes mellitus with microalbuminuria, without long-term current use of insulin [facility])   [place], Drue Stager, MD   1 year ago Dyslipidemia associated with type 2 diabetes mellitus [facility])   [facility] Steele Sizer, MD   1 year ago Diabetes mellitus type 2 in obese [facility])   [facility] Steele Sizer, MD      Future Appointments            In 4 months Ancil Boozer, Drue Stager, MD [facility], [facility]

## 2020-07-17 ENCOUNTER — Other Ambulatory Visit: Payer: Self-pay | Admitting: Family Medicine

## 2020-07-17 DIAGNOSIS — E039 Hypothyroidism, unspecified: Secondary | ICD-10-CM

## 2020-07-31 ENCOUNTER — Other Ambulatory Visit: Payer: Self-pay | Admitting: Family Medicine

## 2020-07-31 DIAGNOSIS — Z1231 Encounter for screening mammogram for malignant neoplasm of breast: Secondary | ICD-10-CM

## 2020-08-25 NOTE — Progress Notes (Signed)
Name: Colleen Curry   MRN: [id number]    DOB: 10-10-1955   Date:08/26/2020       Progress Note  Subjective  Chief Complaint  Follow Up  HPI  DM II:  she had a long history of metabolic syndrome, August 2018 that showed  fasting glucose was 123 and hgbA1C 7.2% , she was having episodes of moodiness, polyphagia, polydipsia and polyuria, that started June 2018 after placed on prednisone by Dr. Sabra Heck ( ortho)  She has family history of DM ( 3 aunts, 2 uncles, brother, sister, daughter and maternal grandmother)) . She is doing well, following a diabetic diet and A1C today is 6.5 % , she takes Crestor for dyslipidemia, and has associated obesity with diabetes, not on medications at this time . Urine micro today , she has a history of microalbuminuria.    Obesity :  she had changed her diet and was in the overweight range however gradually gaining it back, but stable weight since last visit. She stopped drinking sodas and sweet tea, avoiding cookies and desserts.    Alopecia: she has a long history of hair loss, she was seen by Dermatologist but released since 2019.  She states since retired and no children at home she is doing better.    RA:  diagnosed in Feb 2018 by Dr. Meda Coffee , she started on  Methotrexate March 2018 and Plaquenil was added  Fall 2020 but discontinued 07/2020 . Currently only on methotrexate 4 pills daily and  Rinvoq, she noticed constipation since she started medication, she tried miralax without much help, but improves with increase of water intake and activity . She retired in 2021, states swelling and stiffness is down, doing much better now Last visit with Dr. Posey Pronto was on 07/2020 and reviewed labs with patient. Sed rate a little elevated, mild anemia and mild elevation of LFT's    Headaches: she states she has recurrent headaches, started in her 30's, described as gradual onset, throbbing like, it can be temporal, frontal or nuchal area, when intense she has nausea and sound  sensitivity, denies aura, brother has a history of migraine. Taking Fioricet prn . She states episodes down to less than once a month    Hypothyroidism: taking 25 mcg daily alternating with 50 mcg every other day, last TSH was at goal at 2.75 on 02/27/2020    History of Barretts Esophagus: controlled with pantoprazole, she denies heartburn or dysphagia.She had positive back in 2008, three negative EGD 's since last one done by Dr. Esmond Harps 03/26/2019 , she is going back to see them in 10 years    Dyslipidemia: LDL was down to 54, HDL was good at 42 , Currently on Crestor and aspirin and no side effects .   Patient Active Problem List   Diagnosis Date Noted   Controlled type 2 diabetes mellitus with microalbuminuria, without long-term current use of insulin ([place]) 02/09/2018   Seronegative rheumatoid arthritis ([place]) [id number]   Cyclic citrullinated peptide (CCP) antibody positive 03/14/2016   Chondromalacia of both patellae 03/14/2016   Acquired trigger finger 03/14/2016   History of hysterectomy 08/08/2014   Menopause 08/08/2014   History of iron deficiency anemia 08/08/2014   Perennial allergic rhinitis 08/07/2014   Barrett esophagus 08/07/2014   Dyslipidemia 08/07/2014   Family history of diabetes mellitus 08/07/2014   Esophagitis, reflux 08/07/2014   Acquired hypothyroidism [id number]   Dysmetabolic syndrome 123456   Obesity (BMI 30-39.9) 08/07/2014   Vitamin D deficiency 08/07/2014   Tubular  adenoma of colon 08/07/2014    Past Surgical History:  Procedure Laterality Date   ABDOMINAL HYSTERECTOMY      Family History  Problem Relation Age of Onset   Glaucoma Brother    Stroke Paternal Grandfather    Thyroid disease Father    Heart disease Father    Seizures Father    Atrial fibrillation Father    Diabetes Sister    Hypertension Sister    Breast cancer Neg Hx     Social History   Tobacco Use   Smoking status: Never   Smokeless tobacco: Never  Substance Use  Topics   Alcohol use: No    Alcohol/week: 0.0 standard drinks     Current Outpatient Medications:    azelastine (ASTELIN) 0.1 % nasal spray, PLACE 2 SPRAYS INTO BOTH NOSTRILS DAILY. USE IN EACH NOSTRIL AS DIRECTED, Disp: 30 mL, Rfl: 2   Biotin 1000 MCG tablet, Take 1 tablet by mouth daily., Disp: , Rfl:    butalbital-acetaminophen-caffeine (FIORICET) 50-325-40 MG tablet, TAKE 1-2 TABLETS BY MOUTH EVERY 6 (SIX) HOURS AS NEEDED FOR HEADACHE., Disp: 30 tablet, Rfl: 0   Cholecalciferol (VITAMIN D) 50 MCG (2000 UT) CAPS, Take 1 capsule (2,000 Units total) by mouth daily., Disp: 30 capsule, Rfl: 12   fluticasone (FLONASE) 50 MCG/ACT nasal spray, SPRAY 2 SPRAYS INTO EACH NOSTRIL EVERY DAY, Disp: 48 mL, Rfl: 2   folic acid (FOLVITE) 1 MG tablet, Take 1 mg by mouth daily., Disp: , Rfl: 4   levothyroxine (SYNTHROID) 25 MCG tablet, TAKE 1-2 TABLETS BY MOUTH DAILY BEFORE BREAKFAST. ALTERNATE ONE PILL AND TWO PILLS EVERY OTHER DAY, Disp: 135 tablet, Rfl: 1   loratadine (CLARITIN) 10 MG tablet, TAKE 1 TABLET BY MOUTH EVERY DAY, Disp: 90 tablet, Rfl: 1   methotrexate (RHEUMATREX) 2.5 MG tablet, Take 10 mg by mouth once a week., Disp: , Rfl:    montelukast (SINGULAIR) 10 MG tablet, TAKE 1 TABLET BY MOUTH EVERYDAY AT BEDTIME, Disp: 90 tablet, Rfl: 3   pantoprazole (PROTONIX) 40 MG tablet, TAKE 1 TABLET BY MOUTH EVERY DAY, Disp: 90 tablet, Rfl: 3   RINVOQ 15 MG TB24, Take 1 tablet by mouth daily., Disp: , Rfl:    rosuvastatin (CRESTOR) 10 MG tablet, TAKE 1 TABLET BY MOUTH EVERY DAY, Disp: 90 tablet, Rfl: 1  Allergies  Allergen Reactions   Prednisone Other (See Comments)    Elevated blood pressure, glucose spiked, mood swings   Codeine Sulfate Other (See Comments)   Latex Other (See Comments)   Solanum Lycopersicum [Tomato] Other (See Comments)    Thinks she may be allergic to this.  She had mouth and throat irritation.    I personally reviewed active problem list, medication list, allergies, family  history, social history, health maintenance with the patient/caregiver today.   ROS  Constitutional: Negative for fever or weight change.  Respiratory: Negative for cough and shortness of breath.   Cardiovascular: Negative for chest pain or palpitations.  Gastrointestinal: Negative for abdominal pain, no bowel changes.  Musculoskeletal: Negative for gait problem or joint swelling.  Skin: Negative for rash.  Neurological: Negative for dizziness or headache.  No other specific complaints in a complete review of systems (except as listed in HPI above).    Objective  Vitals:   08/26/20 0941  BP: 124/80  Pulse: 80  Resp: 16  Temp: 98 F (36.7 C)  Weight: 167 lb (75.8 kg)  Height: 5' (1.524 m)    Body mass index is 32.61 kg/m.  Physical Exam  Constitutional: Patient appears well-developed and well-nourished. Obese  No distress.  HEENT: head atraumatic, normocephalic, pupils equal and reactive to light,  neck supple Cardiovascular: Normal rate, regular rhythm and normal heart sounds.  No murmur heard. No BLE edema. Pulmonary/Chest: Effort normal and breath sounds normal. No respiratory distress. Abdominal: Soft.  There is no tenderness. Psychiatric: Patient has a normal mood and affect. behavior is normal. Judgment and thought content normal.   Recent Results (from the past 2160 hour(s))  POCT HgB A1C     Status: Abnormal   Collection Time: 08/26/20  9:50 AM  Result Value Ref Range   Hemoglobin A1C 6.5 (A) 4.0 - 5.6 %   HbA1c POC (<> result, manual entry)     HbA1c, POC (prediabetic range)     HbA1c, POC (controlled diabetic range)       PHQ2/9: Depression screen [facility] 2/9 08/26/2020 02/27/2020 08/27/2019 02/27/2019 12/11/2018  Decreased Interest 0 0 0 0 0  Down, Depressed, Hopeless 0 0 0 0 0  PHQ - 2 Score 0 0 0 0 0  Altered sleeping - - 1 0 0  Tired, decreased energy - - 1 0 0  Change in appetite - - 0 0 0  Feeling bad or failure about yourself  - - 0 0 0  Trouble  concentrating - - 0 0 0  Moving slowly or fidgety/restless - - 0 0 0  Suicidal thoughts - - 0 0 0  PHQ-9 Score - - 2 0 0  Difficult doing work/chores - - Not difficult at all - -  Some recent data might be hidden    phq 9 is negative   Fall Risk: Fall Risk  08/26/2020 02/27/2020 08/27/2019 02/27/2019 09/12/2018  Falls in the past year? 0 0 0 0 0  Number falls in past yr: 0 0 0 0 0  Injury with Fall? 0 0 1 0 0      Functional Status Survey: Is the patient deaf or have difficulty hearing?: No Does the patient have difficulty seeing, even when wearing glasses/contacts?: No Does the patient have difficulty concentrating, remembering, or making decisions?: No Does the patient have difficulty walking or climbing stairs?: No Does the patient have difficulty dressing or bathing?: No Does the patient have difficulty doing errands alone such as visiting a doctor's office or shopping?: No    Assessment & Plan  1. Controlled type 2 diabetes mellitus with microalbuminuria, without long-term current use of insulin (HCC)  - POCT HgB A1C - Microalbumin / creatinine urine ratio  2. Acquired hypothyroidism   3. Gastroesophageal reflux disease without esophagitis  Doing well   4. History of Barrett's esophagus   5. Dyslipidemia associated with type 2 diabetes mellitus (HCC)  On Crestor  6. Seronegative rheumatoid arthritis ([place])   7. Vitamin D deficiency  Continue vitamin D   8. Dyslipidemia  Continue medication   9. Alopecia  Doing better   10. Tension headache  Doing well, she will call back if needed for refills  11. Need for shingles vaccine  - Varicella-zoster vaccine IM ([facility])  There are no diagnoses linked to this encounter.

## 2020-08-26 ENCOUNTER — Other Ambulatory Visit: Payer: Self-pay

## 2020-08-26 ENCOUNTER — Ambulatory Visit (INDEPENDENT_AMBULATORY_CARE_PROVIDER_SITE_OTHER): Payer: 59 | Admitting: Family Medicine

## 2020-08-26 ENCOUNTER — Encounter: Payer: Self-pay | Admitting: Family Medicine

## 2020-08-26 VITALS — BP 124/80 | HR 80 | Temp 98.0°F | Resp 16 | Ht 60.0 in | Wt 167.0 lb

## 2020-08-26 DIAGNOSIS — E039 Hypothyroidism, unspecified: Secondary | ICD-10-CM | POA: Diagnosis not present

## 2020-08-26 DIAGNOSIS — Z8719 Personal history of other diseases of the digestive system: Secondary | ICD-10-CM | POA: Diagnosis not present

## 2020-08-26 DIAGNOSIS — R809 Proteinuria, unspecified: Secondary | ICD-10-CM | POA: Diagnosis not present

## 2020-08-26 DIAGNOSIS — E559 Vitamin D deficiency, unspecified: Secondary | ICD-10-CM

## 2020-08-26 DIAGNOSIS — K219 Gastro-esophageal reflux disease without esophagitis: Secondary | ICD-10-CM

## 2020-08-26 DIAGNOSIS — Z23 Encounter for immunization: Secondary | ICD-10-CM | POA: Diagnosis not present

## 2020-08-26 DIAGNOSIS — L659 Nonscarring hair loss, unspecified: Secondary | ICD-10-CM

## 2020-08-26 DIAGNOSIS — E1129 Type 2 diabetes mellitus with other diabetic kidney complication: Secondary | ICD-10-CM | POA: Diagnosis not present

## 2020-08-26 DIAGNOSIS — E785 Hyperlipidemia, unspecified: Secondary | ICD-10-CM

## 2020-08-26 DIAGNOSIS — M0600 Rheumatoid arthritis without rheumatoid factor, unspecified site: Secondary | ICD-10-CM

## 2020-08-26 DIAGNOSIS — G44209 Tension-type headache, unspecified, not intractable: Secondary | ICD-10-CM

## 2020-08-26 DIAGNOSIS — E1169 Type 2 diabetes mellitus with other specified complication: Secondary | ICD-10-CM

## 2020-08-26 LAB — POCT GLYCOSYLATED HEMOGLOBIN (HGB A1C): Hemoglobin A1C: 6.5 % — AB (ref 4.0–5.6)

## 2020-08-27 LAB — MICROALBUMIN / CREATININE URINE RATIO
Creatinine, Urine: 154 mg/dL (ref 20–275)
Microalb Creat Ratio: 5 mcg/mg creat (ref <30)
Microalb, Ur: 0.7 mg/dL

## 2020-08-28 ENCOUNTER — Encounter: Payer: Self-pay | Admitting: Family Medicine

## 2020-09-03 ENCOUNTER — Ambulatory Visit
Admission: RE | Admit: 2020-09-03 | Discharge: 2020-09-03 | Disposition: A | Discharge status: Home / Self Care | Payer: 59 | Source: Ambulatory Visit | Attending: Family Medicine | Admitting: Family Medicine

## 2020-09-03 ENCOUNTER — Other Ambulatory Visit: Payer: Self-pay

## 2020-09-03 DIAGNOSIS — Z1231 Encounter for screening mammogram for malignant neoplasm of breast: Secondary | ICD-10-CM | POA: Insufficient documentation

## 2020-09-08 ENCOUNTER — Other Ambulatory Visit: Payer: Self-pay | Admitting: Family Medicine

## 2020-09-08 DIAGNOSIS — R519 Headache, unspecified: Secondary | ICD-10-CM

## 2020-09-08 NOTE — Telephone Encounter (Signed)
Requested medication (s) are due for refill today: yes  Requested medication (s) are on the active medication list: yes  Last refill:  03/27/20 #30  Future visit scheduled: yes  Notes to clinic:  med not delegated to NT to RF   Requested Prescriptions  Pending Prescriptions Disp Refills   butalbital-acetaminophen-caffeine (FIORICET) 50-325-40 MG tablet [Pharmacy Med Name: BUTALB-ACETAMIN-CAFF 50-325-40] 30 tablet     Sig: TAKE 1-2 TABLETS BY MOUTH EVERY 6 (SIX) HOURS AS NEEDED FOR HEADACHE.     Not Delegated - Analgesics:  Non-Opioid Analgesic Combinations Failed - 09/08/2020  8:58 AM      Failed - This refill cannot be delegated      Passed - Valid encounter within last 12 months    Recent Outpatient Visits           1 week ago Controlled type 2 diabetes mellitus with microalbuminuria, without long-term current use of insulin [facility])   [facility] [place], Drue Stager, MD   6 months ago Controlled type 2 diabetes mellitus with microalbuminuria, without long-term current use of insulin [facility])   [facility] Steele Sizer, MD   1 year ago Barrett's esophagus without dysplasia   [facility] [place], Drue Stager, MD   1 year ago Controlled type 2 diabetes mellitus with microalbuminuria, without long-term current use of insulin [facility])   [facility] [place], Drue Stager, MD   1 year ago Dyslipidemia associated with type 2 diabetes mellitus [facility])   [facility] Steele Sizer, MD       Future Appointments             In 5 months Ancil Boozer, Drue Stager, MD [facility], [facility]

## 2020-09-08 NOTE — Telephone Encounter (Signed)
Next appt is 02/26/21

## 2020-11-04 ENCOUNTER — Other Ambulatory Visit: Payer: Self-pay

## 2020-11-04 ENCOUNTER — Ambulatory Visit (INDEPENDENT_AMBULATORY_CARE_PROVIDER_SITE_OTHER): Payer: 59

## 2020-11-04 DIAGNOSIS — Z23 Encounter for immunization: Secondary | ICD-10-CM | POA: Diagnosis not present

## 2020-12-05 ENCOUNTER — Other Ambulatory Visit: Payer: Self-pay | Admitting: Family Medicine

## 2020-12-05 DIAGNOSIS — E785 Hyperlipidemia, unspecified: Secondary | ICD-10-CM

## 2020-12-05 NOTE — Telephone Encounter (Signed)
Requested Prescriptions  Pending Prescriptions Disp Refills  . rosuvastatin (CRESTOR) 10 MG tablet [Pharmacy Med Name: ROSUVASTATIN CALCIUM 10 MG TAB] 90 tablet 0    Sig: TAKE 1 TABLET BY MOUTH EVERY DAY     Cardiovascular:  Antilipid - Statins Failed - 12/05/2020 12:45 AM      Failed - HDL in normal range and within 360 days    HDL  Date Value Ref Range Status  02/27/2020 42 (L) > OR = 50 mg/dL Final         Passed - Total Cholesterol in normal range and within 360 days    Cholesterol  Date Value Ref Range Status  02/27/2020 112 <200 mg/dL Final         Passed - LDL in normal range and within 360 days    LDL Cholesterol (Calc)  Date Value Ref Range Status  02/27/2020 54 mg/dL (calc) Final    Comment:    Reference range: <100 . Desirable range <100 mg/dL for primary prevention;   <70 mg/dL for patients with CHD or diabetic patients  with > or = 2 CHD risk factors. Marland Kitchen LDL-C is now calculated using the Martin-Hopkins  calculation, which is a validated novel method providing  better accuracy than the Friedewald equation in the  estimation of LDL-C.  Cresenciano Genre et al. Annamaria Helling. 8280;034(91): 2061-2068  (http://education.[facility].com/faq/FAQ164)          Passed - Triglycerides in normal range and within 360 days    Triglycerides  Date Value Ref Range Status  02/27/2020 78 <150 mg/dL Final         Passed - Patient is not pregnant      Passed - Valid encounter within last 12 months    Recent Outpatient Visits          3 months ago Controlled type 2 diabetes mellitus with microalbuminuria, without long-term current use of insulin [facility])   [facility] [place], Drue Stager, MD   9 months ago Controlled type 2 diabetes mellitus with microalbuminuria, without long-term current use of insulin [facility])   [facility] Steele Sizer, MD   1 year ago Barrett's esophagus without dysplasia   [facility] [place], Drue Stager, MD   1  year ago Controlled type 2 diabetes mellitus with microalbuminuria, without long-term current use of insulin [facility])   [facility] [place], Drue Stager, MD   1 year ago Dyslipidemia associated with type 2 diabetes mellitus [facility])   [facility] Steele Sizer, MD      Future Appointments            In 2 months Ancil Boozer, Drue Stager, MD [facility], [facility]

## 2021-01-06 ENCOUNTER — Other Ambulatory Visit: Payer: Self-pay | Admitting: Family Medicine

## 2021-01-06 DIAGNOSIS — E039 Hypothyroidism, unspecified: Secondary | ICD-10-CM

## 2021-01-06 NOTE — Telephone Encounter (Signed)
Requested Prescriptions  Pending Prescriptions Disp Refills   levothyroxine (SYNTHROID) 25 MCG tablet [Pharmacy Med Name: LEVOTHYROXINE 25 MCG TABLET] 135 tablet 0    Sig: TAKE 1-2 TABLETS BY MOUTH DAILY BEFORE BREAKFAST. ALTERNATE ONE PILL AND TWO PILLS EVERY OTHER DAY     Endocrinology:  Hypothyroid Agents Failed - 01/06/2021  1:42 AM      Failed - TSH needs to be rechecked within 3 months after an abnormal result. Refill until TSH is due.      Passed - TSH in normal range and within 360 days    TSH  Date Value Ref Range Status  02/27/2020 2.75 0.40 - 4.50 mIU/L Final         Passed - Valid encounter within last 12 months    Recent Outpatient Visits          4 months ago Controlled type 2 diabetes mellitus with microalbuminuria, without long-term current use of insulin [facility])   [facility] [place], Drue Stager, MD   10 months ago Controlled type 2 diabetes mellitus with microalbuminuria, without long-term current use of insulin [facility])   [facility] Steele Sizer, MD   1 year ago Barrett's esophagus without dysplasia   [facility] [place], Drue Stager, MD   1 year ago Controlled type 2 diabetes mellitus with microalbuminuria, without long-term current use of insulin [facility])   [facility] [place], Drue Stager, MD   2 years ago Dyslipidemia associated with type 2 diabetes mellitus [facility])   [facility] Steele Sizer, MD      Future Appointments            In 1 month Ancil Boozer, Drue Stager, MD [facility], [facility]

## 2021-02-26 ENCOUNTER — Ambulatory Visit: Payer: 59 | Admitting: Family Medicine

## 2021-03-02 ENCOUNTER — Other Ambulatory Visit: Payer: Self-pay | Admitting: Family Medicine

## 2021-03-02 DIAGNOSIS — E785 Hyperlipidemia, unspecified: Secondary | ICD-10-CM

## 2021-03-02 MED ORDER — ROSUVASTATIN CALCIUM 10 MG PO TABS: 10.0000 mg | ORAL_TABLET | Freq: Every day | ORAL | 0 refills | Status: AC

## 2021-03-02 NOTE — Telephone Encounter (Signed)
Called patient and she is not coming here anymore for her insurance is out of network

## 2021-03-31 ENCOUNTER — Other Ambulatory Visit: Payer: Self-pay | Admitting: Family Medicine

## 2021-03-31 DIAGNOSIS — E039 Hypothyroidism, unspecified: Secondary | ICD-10-CM

## 2021-07-07 ENCOUNTER — Other Ambulatory Visit: Payer: Self-pay | Admitting: Family Medicine

## 2021-07-07 DIAGNOSIS — E039 Hypothyroidism, unspecified: Secondary | ICD-10-CM

## 2021-10-03 ENCOUNTER — Other Ambulatory Visit: Payer: Self-pay | Admitting: Family Medicine

## 2021-10-03 DIAGNOSIS — E039 Hypothyroidism, unspecified: Secondary | ICD-10-CM

## 2021-11-12 ENCOUNTER — Other Ambulatory Visit: Payer: Self-pay | Admitting: Family Medicine

## 2021-11-12 DIAGNOSIS — E785 Hyperlipidemia, unspecified: Secondary | ICD-10-CM

## 2021-11-12 NOTE — Telephone Encounter (Signed)
Requested medication (s) are due for refill today: yes  Requested medication (s) are on the active medication list: yes  Last refill:  03/02/21 #30 with 0 RF  Future visit scheduled: no, labs 02/2020  Notes to clinic:  I believe pt has a new provider, please assess.      Requested Prescriptions  Pending Prescriptions Disp Refills   rosuvastatin (CRESTOR) 10 MG tablet [Pharmacy Med Name: ROSUVASTATIN CALCIUM 10 MG TAB] 30 tablet 0    Sig: TAKE 1 TABLET BY MOUTH EVERY DAY     Cardiovascular:  Antilipid - Statins 2 Failed - 11/12/2021 12:33 PM      Failed - Cr in normal range and within 360 days    Creat  Date Value Ref Range Status  02/27/2020 0.92 0.50 - 0.99 mg/dL Final    Comment:    For patients >56 years of age, the reference limit for Creatinine is approximately 13% higher for people identified as African-American. .    Creatinine, POC  Date Value Ref Range Status  12/07/2016 Negative mg/dL Final   Creatinine, Urine  Date Value Ref Range Status  08/26/2020 154 20 - 275 mg/dL Final         Failed - Valid encounter within last 12 months    Recent Outpatient Visits           1 year ago Controlled type 2 diabetes mellitus with microalbuminuria, without long-term current use of insulin [facility])   [facility] [place], Drue Stager, MD   1 year ago Controlled type 2 diabetes mellitus with microalbuminuria, without long-term current use of insulin [facility])   [facility] [place], Drue Stager, MD   2 years ago Barrett's esophagus without dysplasia   [facility] [place], Drue Stager, MD   2 years ago Controlled type 2 diabetes mellitus with microalbuminuria, without long-term current use of insulin [facility])   [facility] [place], Drue Stager, MD   2 years ago Dyslipidemia associated with type 2 diabetes mellitus [facility])   [facility] [place], Drue Stager, MD              Failed - Lipid Panel in normal  range within the last 12 months    Cholesterol  Date Value Ref Range Status  02/27/2020 112 <200 mg/dL Final   LDL Cholesterol (Calc)  Date Value Ref Range Status  02/27/2020 54 mg/dL (calc) Final    Comment:    Reference range: <100 . Desirable range <100 mg/dL for primary prevention;   <70 mg/dL for patients with CHD or diabetic patients  with > or = 2 CHD risk factors. Marland Kitchen LDL-C is now calculated using the Martin-Hopkins  calculation, which is a validated novel method providing  better accuracy than the Friedewald equation in the  estimation of LDL-C.  Cresenciano Genre et al. Annamaria Helling. 0938;182(99): 2061-2068  (http://education.[facility].com/faq/FAQ164)    HDL  Date Value Ref Range Status  02/27/2020 42 (L) > OR = 50 mg/dL Final   Triglycerides  Date Value Ref Range Status  02/27/2020 78 <150 mg/dL Final         Passed - Patient is not pregnant

## 2022-01-01 ENCOUNTER — Other Ambulatory Visit: Payer: Self-pay | Admitting: Family Medicine

## 2022-01-01 DIAGNOSIS — E039 Hypothyroidism, unspecified: Secondary | ICD-10-CM

## 2022-01-03 NOTE — Telephone Encounter (Signed)
Pt confirmed that she no longer comes here for her care

## 2022-01-03 NOTE — Telephone Encounter (Signed)
Spoke to pt. She is not our pt any longer and will call her pharmacy
